# Patient Record
Sex: Male | Born: 1985 | Hispanic: No | Marital: Married | Smoking: Current some day smoker
Health system: Southern US, Community
[De-identification: ages and names within clinical notes are randomized; demographics above are authoritative.]

## PROBLEM LIST (undated history)

## (undated) DIAGNOSIS — E559 Vitamin D deficiency, unspecified: Secondary | ICD-10-CM

## (undated) DIAGNOSIS — M549 Dorsalgia, unspecified: Secondary | ICD-10-CM

## (undated) DIAGNOSIS — D569 Thalassemia, unspecified: Secondary | ICD-10-CM

## (undated) HISTORY — PX: HAND SURGERY: SHX662

---

## 2015-10-28 ENCOUNTER — Emergency Department (HOSPITAL_COMMUNITY)
Admission: EM | Admit: 2015-10-28 | Discharge: 2015-10-28 | Disposition: A | Discharge status: Home / Self Care | Payer: Medicaid Other | Attending: Emergency Medicine | Admitting: Emergency Medicine

## 2015-10-28 ENCOUNTER — Emergency Department (HOSPITAL_COMMUNITY): Payer: Medicaid Other

## 2015-10-28 ENCOUNTER — Encounter (HOSPITAL_COMMUNITY): Payer: Self-pay | Admitting: *Deleted

## 2015-10-28 DIAGNOSIS — J069 Acute upper respiratory infection, unspecified: Secondary | ICD-10-CM | POA: Diagnosis not present

## 2015-10-28 DIAGNOSIS — Z720 Tobacco use: Secondary | ICD-10-CM | POA: Insufficient documentation

## 2015-10-28 DIAGNOSIS — H9203 Otalgia, bilateral: Secondary | ICD-10-CM | POA: Insufficient documentation

## 2015-10-28 DIAGNOSIS — R51 Headache: Secondary | ICD-10-CM | POA: Insufficient documentation

## 2015-10-28 DIAGNOSIS — J029 Acute pharyngitis, unspecified: Secondary | ICD-10-CM | POA: Diagnosis present

## 2015-10-28 DIAGNOSIS — R079 Chest pain, unspecified: Secondary | ICD-10-CM | POA: Diagnosis not present

## 2015-10-28 LAB — RAPID STREP SCREEN (MED CTR MEBANE ONLY): Streptococcus, Group A Screen (Direct): NEGATIVE

## 2015-10-28 MED ORDER — PSEUDOEPHEDRINE HCL 30 MG PO TABS: 30.0000 mg | ORAL_TABLET | ORAL | Status: DC | PRN

## 2015-10-28 MED ORDER — GUAIFENESIN-DM 100-10 MG/5ML PO SYRP: 5.0000 mL | ORAL_SOLUTION | ORAL | Status: DC | PRN

## 2015-10-28 NOTE — ED Notes (Signed)
Pt c/o sore throat & bil ear pain, & non productive cough onset x 1 wk with no relief with OTC meds, A&O x4, pt denies n/v/d

## 2015-10-28 NOTE — ED Notes (Signed)
Declined W/C at D/C and was escorted to lobby by RN. 

## 2015-10-28 NOTE — ED Provider Notes (Signed)
CSN: [id number]     Arrival date & time 10/28/15  1223 History   First MD Initiated Contact with Patient 10/28/15 1310     Chief Complaint  Patient presents with  . Sore Throat     (Consider location/radiation/quality/duration/timing/severity/associated sxs/prior Treatment) HPI Todd Miller is a 29 y.o. male  with no medical problems, presents to emergency department complaining of sore throat, nasal congestion, cough, chest pain. Patient states symptoms started 1 week ago. Patient is from [place] but just came to [place] one [place] ago from [place]. States her vaccinations are up-to-date. Denies any fever or chills. States took some over-the-counter cold medication which did not help. States her significant other is sick with the same exact symptoms. States main concern is pain in the chest especially with coughing.  History reviewed. No pertinent past medical history. History reviewed. No pertinent past surgical history. No family history on file. Social History  Substance Use Topics  . Smoking status: Current Every Day Smoker    Types: Pipe  . Smokeless tobacco: None  . Alcohol Use: No    Review of Systems  Constitutional: Negative for fever and chills.  HENT: Positive for congestion, ear pain, postnasal drip, rhinorrhea, sinus pressure and sore throat.   Respiratory: Positive for cough and chest tightness. Negative for shortness of breath.   Cardiovascular: Positive for chest pain. Negative for palpitations and leg swelling.  Gastrointestinal: Negative for nausea, vomiting, abdominal pain, diarrhea and abdominal distention.  Musculoskeletal: Negative for myalgias, arthralgias, neck pain and neck stiffness.  Skin: Negative for rash.  Allergic/Immunologic: Negative for immunocompromised state.  Neurological: Positive for headaches. Negative for dizziness, weakness, light-headedness and numbness.      Allergies  Review of patient's allergies indicates no known  allergies.  Home Medications   Prior to Admission medications   Not on File   BP 122/79 mmHg  Pulse 57  Temp(Src) 97.7 F (36.5 C) (Oral)  Resp 16  Ht 5\' 5"  (1.651 m)  Wt 129 lb 5 oz (58.656 kg)  BMI 21.52 kg/m2  SpO2 98% Physical Exam  Constitutional: He is oriented to person, place, and time. He appears well-developed and well-nourished. No distress.  HENT:  Head: Normocephalic and atraumatic.  Right Ear: External ear normal.  Left Ear: External ear normal.  Mouth/Throat: Oropharynx is clear and moist.  Clear rhinorrhea, pharynx erythemous, uvula midline  Eyes: Conjunctivae are normal.  Neck: Normal range of motion. Neck supple.  No meningeal signs  Cardiovascular: Normal rate, regular rhythm and normal heart sounds.   Pulmonary/Chest: Effort normal and breath sounds normal. No respiratory distress. He has no wheezes. He has no rales.  Abdominal: Soft. Bowel sounds are normal. There is no tenderness.  Musculoskeletal: He exhibits no edema or tenderness.  Lymphadenopathy:    He has no cervical adenopathy.  Neurological: He is alert and oriented to person, place, and time.  Skin: Skin is warm and dry. No erythema.  Psychiatric: He has a normal mood and affect.  Nursing note and vitals reviewed.   ED Course  Procedures (including critical care time) Labs Review Labs Reviewed  RAPID STREP SCREEN (NOT AT [facility])  CULTURE, GROUP A STREP    Imaging Review Dg Chest 2 View  10/28/2015  CLINICAL DATA:  Sore throat and bilateral ear pain. Nonproductive cough EXAM: CHEST  2 VIEW COMPARISON:  None FINDINGS: The heart size and mediastinal contours are within normal limits. Both lungs are clear. The visualized skeletal structures are unremarkable. IMPRESSION: No active  cardiopulmonary disease. Electronically Signed   By: Signa Kell M.D.   On: 10/28/2015 14:26   I have personally reviewed and evaluated these images and lab results as part of my medical decision-making.   EKG  Interpretation None      MDM   Final diagnoses:  URI (upper respiratory infection)      Pt with URI symptoms. Afebrile. Non toxic appearing. Vs normal. Most likely viral infection. Given recent immigration from [place] countries, x-ray of the chest and strep screen obtained and are both negative. Home with symptomatic treatment. Patient's family member in emergency department with the same symptoms. Return precautions discussed.  Filed Vitals:   10/28/15 1241  BP: 122/79  Pulse: 57  Temp: 97.7 F (36.5 C)  Resp: [address], PA-C 10/28/15 1636  Gwyneth Sprout, MD 10/28/15 1640

## 2015-10-28 NOTE — Discharge Instructions (Signed)
Sudafed as prescribed for congestion. Robitussin as prescribed for cough. Follow up with urgent care or family doctor if not improving.    Upper Respiratory Infection, Adult Most upper respiratory infections (URIs) are caused by a virus. A URI affects the nose, throat, and upper air passages. The most common type of URI is often called "the common cold." HOME CARE   Take medicines only as told by your doctor.  Gargle warm saltwater or take cough drops to comfort your throat as told by your doctor.  Use a warm mist humidifier or inhale steam from a shower to increase air moisture. This may make it easier to breathe.  Drink enough fluid to keep your pee (urine) clear or pale yellow.  Eat soups and other clear broths.  Have a healthy diet.  Rest as needed.  Go back to work when your fever is gone or your doctor says it is okay.  You may need to stay home longer to avoid giving your URI to others.  You can also wear a face mask and wash your hands often to prevent spread of the virus.  Use your inhaler more if you have asthma.  Do not use any tobacco products, including cigarettes, chewing tobacco, or electronic cigarettes. If you need help quitting, ask your doctor. GET HELP IF:  You are getting worse, not better.  Your symptoms are not helped by medicine.  You have chills.  You are getting more short of breath.  You have brown or red mucus.  You have yellow or brown discharge from your nose.  You have pain in your face, especially when you bend forward.  You have a fever.  You have puffy (swollen) neck glands.  You have pain while swallowing.  You have white areas in the back of your throat. GET HELP RIGHT AWAY IF:   You have very bad or constant:  Headache.  Ear pain.  Pain in your forehead, behind your eyes, and over your cheekbones (sinus pain).  Chest pain.  You have long-lasting (chronic) lung disease and any of the  following:  Wheezing.  Long-lasting cough.  Coughing up blood.  A change in your usual mucus.  You have a stiff neck.  You have changes in your:  Vision.  Hearing.  Thinking.  Mood. MAKE SURE YOU:   Understand these instructions.  Will watch your condition.  Will get help right away if you are not doing well or get worse.   This information is not intended to replace advice given to you by your health care provider. Make sure you discuss any questions you have with your health care provider.   Document Released: 05/26/2008 Document Revised: 04/24/2015 Document Reviewed: 03/15/2014 Elsevier Interactive Patient Education [employer]2016 Elsevier Inc.

## 2015-10-31 LAB — CULTURE, GROUP A STREP

## 2016-01-10 LAB — CBC AND DIFFERENTIAL
HEMATOCRIT: 40 % — AB (ref 41–53)
HEMOGLOBIN: 13.2 g/dL — AB (ref 13.5–17.5)
NEUTROS ABS: 4 /uL
Platelets: 175 10*3/uL (ref 150–399)
WBC: 7.1 10^3/mL

## 2016-01-10 LAB — BASIC METABOLIC PANEL
BUN: 12 mg/dL (ref 4–21)
Creatinine: 0.7 mg/dL (ref 0.6–1.3)
GLUCOSE: 110 mg/dL
POTASSIUM: 4 mmol/L (ref 3.4–5.3)
SODIUM: 138 mmol/L (ref 137–147)

## 2016-01-10 LAB — HEPATIC FUNCTION PANEL
ALK PHOS: 56 U/L (ref 25–125)
ALT: 14 U/L (ref 10–40)
AST: 15 U/L (ref 14–40)

## 2016-01-10 LAB — TSH: TSH: 1.22 u[IU]/mL (ref 0.41–5.90)

## 2016-01-23 ENCOUNTER — Other Ambulatory Visit (HOSPITAL_COMMUNITY)
Admission: RE | Admit: 2016-01-23 | Discharge: 2016-01-23 | Disposition: A | Discharge status: Home / Self Care | Payer: Medicaid Other | Source: Ambulatory Visit | Attending: Family Medicine | Admitting: Family Medicine

## 2016-01-23 ENCOUNTER — Ambulatory Visit (INDEPENDENT_AMBULATORY_CARE_PROVIDER_SITE_OTHER): Payer: Medicaid Other | Admitting: Family Medicine

## 2016-01-23 VITALS — BP 119/70 | HR 57 | Temp 98.0°F | Ht 67.5 in | Wt 133.4 lb

## 2016-01-23 DIAGNOSIS — R350 Frequency of micturition: Secondary | ICD-10-CM | POA: Diagnosis not present

## 2016-01-23 DIAGNOSIS — H9391 Unspecified disorder of right ear: Secondary | ICD-10-CM | POA: Diagnosis not present

## 2016-01-23 DIAGNOSIS — Z113 Encounter for screening for infections with a predominantly sexual mode of transmission: Secondary | ICD-10-CM | POA: Insufficient documentation

## 2016-01-23 DIAGNOSIS — R300 Dysuria: Secondary | ICD-10-CM

## 2016-01-23 DIAGNOSIS — Z008 Encounter for other general examination: Secondary | ICD-10-CM

## 2016-01-23 DIAGNOSIS — Z0289 Encounter for other administrative examinations: Secondary | ICD-10-CM | POA: Insufficient documentation

## 2016-01-23 DIAGNOSIS — F411 Generalized anxiety disorder: Secondary | ICD-10-CM | POA: Diagnosis not present

## 2016-01-23 DIAGNOSIS — F17200 Nicotine dependence, unspecified, uncomplicated: Secondary | ICD-10-CM | POA: Diagnosis not present

## 2016-01-23 LAB — POCT URINALYSIS DIPSTICK
BILIRUBIN UA: NEGATIVE
Glucose, UA: NEGATIVE
Ketones, UA: NEGATIVE
LEUKOCYTES UA: NEGATIVE
NITRITE UA: NEGATIVE
PH UA: 6
Protein, UA: NEGATIVE
Spec Grav, UA: 1.025
Urobilinogen, UA: 0.2

## 2016-01-23 LAB — POCT UA - MICROSCOPIC ONLY

## 2016-01-23 NOTE — Assessment & Plan Note (Addendum)
Several year history of this is somewhat confusing. He had a UA collected at last visit with outside office but we don't have this information. We will recollect this, send for culture, collect GC/C as well. Initial UA today had some hemoglobin so will need to re-test at next visit in 1 month.  

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
## 2016-01-23 NOTE — Assessment & Plan Note (Signed)
Patient did not complain of this but noted on exam what appears to be localized hemotympanum of right TM. There is grossly decreased hearing on the right side. Does not complain of any other hearing symptoms. Given return precautions for pain, drainage, bleeding, otherwise will re-evaluate this in 1 month and if not improving refer to ENT.

## 2016-01-23 NOTE — Patient Instructions (Addendum)
Anxiety: Continue the sertraline  once a day. I would like to see you back in about 4 weeks to see how this medicine is doing. We may make a change in the dosing at that time.  Right ear:  We will look at this again at your follow up visit in 3-4 weeks. If you develop any pain, drainage, please call the clinic before this visit

## 2016-01-23 NOTE — Assessment & Plan Note (Signed)
Recently started on sertraline . Will continue this. He has obvious reason for this as his family is still in [place] and [place] with no known timeframe for getting to the [place]. He does identify this as a huge stressor. Will follow him for this in 3 weeks, may titrate sertraline up at that visit.

## 2016-01-25 LAB — URINE CULTURE
COLONY COUNT: NO GROWTH
ORGANISM ID, BACTERIA: NO GROWTH

## 2016-01-25 LAB — URINE CYTOLOGY ANCILLARY ONLY
Chlamydia: NEGATIVE
Neisseria Gonorrhea: NEGATIVE

## 2016-02-13 ENCOUNTER — Encounter: Payer: Self-pay | Admitting: Family Medicine

## 2016-02-13 LAB — VITAMIN D 25-HYDROXY, D2 + D3: Vit D, 25-Hydroxy: 12 ng/ml — ABNORMAL LOW

## 2016-02-13 LAB — CBC AND DIFFERENTIAL
MCV: 60.1 fL — ABNORMAL LOW
RDW: 16.1 — ABNORMAL HIGH

## 2016-02-15 ENCOUNTER — Ambulatory Visit (INDEPENDENT_AMBULATORY_CARE_PROVIDER_SITE_OTHER): Payer: Medicaid Other | Admitting: Family Medicine

## 2016-02-15 VITALS — BP 121/74 | HR 70 | Temp 98.4°F | Ht 68.0 in | Wt 133.0 lb

## 2016-02-15 DIAGNOSIS — E559 Vitamin D deficiency, unspecified: Secondary | ICD-10-CM | POA: Diagnosis not present

## 2016-02-15 DIAGNOSIS — R350 Frequency of micturition: Secondary | ICD-10-CM | POA: Diagnosis not present

## 2016-02-15 DIAGNOSIS — H9391 Unspecified disorder of right ear: Secondary | ICD-10-CM

## 2016-02-15 DIAGNOSIS — D649 Anemia, unspecified: Secondary | ICD-10-CM | POA: Diagnosis not present

## 2016-02-15 LAB — POCT URINALYSIS DIPSTICK
Bilirubin, UA: NEGATIVE
GLUCOSE UA: NEGATIVE
Leukocytes, UA: NEGATIVE
Nitrite, UA: NEGATIVE
Protein, UA: NEGATIVE
Urobilinogen, UA: 1
pH, UA: 6

## 2016-02-15 MED ORDER — FERROUS SULFATE 325 (65 FE) MG PO TABS: 325.0000 mg | ORAL_TABLET | Freq: Every day | ORAL | Status: DC

## 2016-02-15 MED ORDER — VITAMIN D3 1.25 MG (50000 UT) PO TABS: 1.0000 | ORAL_TABLET | ORAL | Status: DC

## 2016-02-15 NOTE — Progress Notes (Signed)
   Subjective:    Patient ID: Todd Miller, male    DOB: July 23, 1986, 30 y.o.   MRN: [id number]  HPI  [employer] video interpreter Todd Miller [phone] used   CC: follow up ear  # Right ear:  At initial visit on ear exam it was noted what appeared to be intratympanic blood on the right TM  Has not had any issues with right ear, no drainage, no pain, no change in hearing  # Nausea/lack of appetite  Present for "years" -- at least 2  He will force himself to eat but doesn't like to do this  Nausea but no vomiting  No diarrhea, constipation, abdominal pain  Reviewed labs from previous PCP, he is iron deficient (likely with Hgb 13, MCV 60), and vitamin D deficient (level was 12)  Social Hx: current smoker  Review of Systems   See HPI for ROS.   Past medical history, surgical, family, and social history reviewed and updated in the EMR as appropriate. Objective:  BP 121/74 mmHg  Pulse 70  Temp(Src) 98.4 F (36.9 C) (Oral)  Ht  (1.727 m)  Wt 133 lb (60.328 kg)  BMI 20.23 kg/m2 Vitals and nursing note reviewed  General: no apparent distress  Ears: both TMs are pearly gray bilaterally without effusion. There is no blood noted on the right TM CV: normal rate, regular rhythm, no murmurs, rubs or gallop  Resp: clear to auscultation bilaterally, normal effort Abdomen: thin, soft, nontender, nondistended, normal bowel sounds   Assessment & Plan:  Problem of right ear No blood present on right TM today, so likely transient and has self resolved. No additional follow up needed unless develops issues.  Anemia Likely iron deficiency (MCV 60, increased RDW). In setting of poor appetite this may be the cause, though would not rule out all other causes especially in setting of low vitamin D level as well. Will replete with iron supplement, check CBC and likely iron study in about 6-8 weeks.  Vitamin D deficiency Level at 12 at prior PCP. Replete 50k units x 8 weeks then recheck  level.  Urinary frequency Repeat UA was still positive for some hemoglobin. Urine culture was negative at last visit, and negative GC/C.   Per Todd Miller last note, will need treatment for schisto -- will discuss with Todd Miller and contact patient.

## 2016-02-15 NOTE — Patient Instructions (Addendum)
Right ear looks normal. We were correct that it was blood from last visit and this has gone away now. We do not need to have you see the ENT (Ear doctors)  I received the lab work from your other doctor's office. Your hemoglobin and vitamin D were both very low, which means you have Anemia and Vitamin D deficiency.  Anemia: Start by taking the iron tablet once a day. This can make you constipated and sick to your stomach, but continue using it as it should hopefully make you feel better  Do not take the iron pill with millk, tums, raw vegetables, high fiber foods   Vitamin D deficiency: Take 50,000 units of Vitamin D3 once a week for 8 weeks. After this you should take 5,000 units daily, and return to the clinic to get your blood checked.  Vitamin D is best absorbed when eaten with foods or drinks high in fat.

## 2016-02-19 DIAGNOSIS — E559 Vitamin D deficiency, unspecified: Secondary | ICD-10-CM | POA: Insufficient documentation

## 2016-02-19 DIAGNOSIS — D649 Anemia, unspecified: Secondary | ICD-10-CM | POA: Insufficient documentation

## 2016-02-19 NOTE — Assessment & Plan Note (Signed)
Level at 12 at prior PCP. Replete 50k units x 8 weeks then recheck level.

## 2016-02-19 NOTE — Assessment & Plan Note (Signed)
No blood present on right TM today, so likely transient and has self resolved. No additional follow up needed unless develops issues.

## 2016-02-19 NOTE — Assessment & Plan Note (Signed)
Likely iron deficiency (MCV 60, increased RDW). In setting of poor appetite this may be the cause, though would not rule out all other causes especially in setting of low vitamin D level as well. Will replete with iron supplement, check CBC and likely iron study in about 6-8 weeks.

## 2016-02-19 NOTE — Assessment & Plan Note (Signed)
Repeat UA was still positive for some hemoglobin. Urine culture was negative at last visit, and negative GC/C.   Per Dr. Tyson Alias last note, will need treatment for schisto -- will discuss with Dr. Gwendolyn Grant and contact patient.

## 2016-02-25 ENCOUNTER — Telehealth: Payer: Self-pay | Admitting: Family Medicine

## 2016-02-25 DIAGNOSIS — B659 Schistosomiasis, unspecified: Secondary | ICD-10-CM

## 2016-02-25 MED ORDER — PRAZIQUANTEL POWD: 1200.0000 mg | Freq: Four times a day (QID) | Status: DC

## 2016-02-25 NOTE — Telephone Encounter (Signed)
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 I called the local pharmacies and the only one in stock is [facility], informed her of this and that the prescription would be sent there. No further questions. -Dr. Waynetta SandyWight

## 2016-02-26 ENCOUNTER — Telehealth: Payer: Self-pay | Admitting: Family Medicine

## 2016-02-26 NOTE — Telephone Encounter (Signed)
Cary called because the medication that the doctor is trying to get the patient at [facility] is 600.00 they do not file insurance and they would have to special order this. They suggested that we try [facility] since they take medicaid. The other issues is that Medicaid might not cover this and we could try a prior authorization to see. If Medicaid will not cover this a different medication will need to be prescribe. jw

## 2016-02-28 MED ORDER — PRAZIQUANTEL 600 MG PO TABS: 1200.0000 mg | ORAL_TABLET | Freq: Three times a day (TID) | ORAL | Status: DC

## 2016-02-28 NOTE — Telephone Encounter (Signed)
Called and spoke with [facility] on [address], they actually have the praziquantel tablets in stock, rx sent there. Spoke with Revereary on the phone and told her that they should try and get this filled, but we may need to do some paperwork before Medicaid will cover; first step is providing [facility] with their medicaid cards. She had no further questions. -Dr. Waynetta SandyWight

## 2016-04-17 ENCOUNTER — Ambulatory Visit: Payer: Medicaid Other | Admitting: Family Medicine

## 2016-04-18 ENCOUNTER — Ambulatory Visit: Payer: Medicaid Other | Admitting: Family Medicine

## 2016-04-18 ENCOUNTER — Ambulatory Visit (INDEPENDENT_AMBULATORY_CARE_PROVIDER_SITE_OTHER): Payer: Medicaid Other | Admitting: Family Medicine

## 2016-04-18 ENCOUNTER — Encounter: Payer: Self-pay | Admitting: Family Medicine

## 2016-04-18 VITALS — BP 119/75 | HR 67 | Temp 98.6°F | Ht 68.0 in | Wt 133.3 lb

## 2016-04-18 DIAGNOSIS — R0989 Other specified symptoms and signs involving the circulatory and respiratory systems: Secondary | ICD-10-CM | POA: Insufficient documentation

## 2016-04-18 DIAGNOSIS — E559 Vitamin D deficiency, unspecified: Secondary | ICD-10-CM | POA: Diagnosis not present

## 2016-04-18 DIAGNOSIS — R1310 Dysphagia, unspecified: Secondary | ICD-10-CM

## 2016-04-18 DIAGNOSIS — D509 Iron deficiency anemia, unspecified: Secondary | ICD-10-CM | POA: Diagnosis present

## 2016-04-18 LAB — CBC WITH DIFFERENTIAL/PLATELET
Basophils Absolute: 0 cells/uL (ref 0–200)
Basophils Relative: 0 %
EOS PCT: 1 %
Eosinophils Absolute: 70 cells/uL (ref 15–500)
HCT: 40.5 % (ref 38.5–50.0)
HEMOGLOBIN: 13.2 g/dL (ref 13.2–17.1)
LYMPHS ABS: 2170 {cells}/uL (ref 850–3900)
Lymphocytes Relative: 31 %
MCH: 19.4 pg — ABNORMAL LOW (ref 27.0–33.0)
MCHC: 32.6 g/dL (ref 32.0–36.0)
MCV: 59.6 fL — ABNORMAL LOW (ref 80.0–100.0)
MONOS PCT: 6 %
Monocytes Absolute: 420 cells/uL (ref 200–950)
NEUTROS ABS: 4340 {cells}/uL (ref 1500–7800)
NEUTROS PCT: 62 %
PLATELETS: 134 10*3/uL — AB (ref 140–400)
RBC: 6.79 MIL/uL — AB (ref 4.20–5.80)
RDW: 16 % — ABNORMAL HIGH (ref 11.0–15.0)
WBC: 7 10*3/uL (ref 3.8–10.8)

## 2016-04-18 LAB — ANEMIA PANEL
%SAT: 61 % — AB (ref 15–60)
ABS RETIC: 88270 {cells}/uL (ref 25000–90000)
FOLATE: 14.6 ng/mL (ref >5.4)
Ferritin: 53 ng/mL (ref 20–345)
Iron: 159 ug/dL (ref 50–180)
RBC.: 6.79 MIL/uL — ABNORMAL HIGH (ref 4.20–5.80)
RETIC CT PCT: 1.3 %
TIBC: 260 ug/dL (ref 250–425)
UIBC: 101 ug/dL — ABNORMAL LOW (ref 125–400)
Vitamin B-12: 170 pg/mL — ABNORMAL LOW (ref 200–1100)

## 2016-04-18 NOTE — Progress Notes (Signed)
Subjective:     Patient ID: Todd Miller, male   DOB: May 23, 1986, 30 y.o.   MRN: [id number]  HPI  Anemia: Here for follow up. He is compliant with his iron pills. Vitamin D: Here for follow up. He is compliant with his Vit D supplement. Throat pain: Feels like there is something stuck in his throat. This has been ongoing for two weeks. He had this in the past before coming to the state which resolved on its own till lately. It occurs whenever he is eating solid. No difficulty swallowing. No nausea or vomiting. No change in bowel habit. No stomach pain. No weight loss.    Current Outpatient Prescriptions on File Prior to Visit  Medication Sig Dispense Refill  . Cholecalciferol (VITAMIN D3) 50000 units TABS Take 1 tablet by mouth once a week. For 8 weeks 8 tablet 0  . ferrous sulfate (FERROUSUL) 325 (65 FE) MG tablet Take 1 tablet (325 mg total) by mouth daily with breakfast. 90 tablet 1  . praziquantel (BILTRICIDE) 600 MG tablet Take 2 tablets (1,200 mg total) by mouth 3 (three) times daily. For 3 doses total, take with food (Patient not taking: Reported on 04/18/2016) 6 tablet 0   No current facility-administered medications on file prior to visit.   History reviewed. No pertinent past medical history. Filed Vitals:   04/18/16 1033  BP: 119/75  Pulse: 67  Temp: 98.6 F (37 C)  TempSrc: Oral  Height: 5\' 8"  (1.727 m)  Weight: 133 lb 4.8 oz (60.464 kg)  SpO2: 98%      Review of Systems  HENT:       Feeling of food stuck in his throat.  Respiratory: Negative.   Cardiovascular: Negative.   Gastrointestinal: Negative.   All other systems reviewed and are negative.      Objective:   Physical Exam  Constitutional: He is oriented to person, place, and time. He appears well-developed. No distress.  HENT:  Head: Normocephalic.  Mouth/Throat: Uvula is midline, oropharynx is clear and moist and mucous membranes are normal.  Cardiovascular: Normal rate, regular rhythm and normal  heart sounds.   No murmur heard. Pulmonary/Chest: Effort normal and breath sounds normal. No respiratory distress. He has no wheezes. He exhibits no tenderness.  Abdominal: Soft. Bowel sounds are normal. He exhibits no distension and no mass. There is no tenderness.  Neurological: He is alert and oriented to person, place, and time.  Nursing note and vitals reviewed.      Assessment:     Anemia: Vitamin D: Throat problem    Plan:     Check problem list.

## 2016-04-18 NOTE — Assessment & Plan Note (Signed)
Last CBC barely low. Concern for low MCV and elevated RDW which could suggest iron deficiency anemia,but could be due to other hematologic causes ( Thalassemia, Sickle Cell Anemia). CBC and anemia panel checked today. I will contact him with result. Continue iron supplement. F/U with PCP for further evaluation and management.

## 2016-04-18 NOTE — Assessment & Plan Note (Signed)
Etiology unclear. Differential include tumor ( too young), GERD,anxiety, abnormal esophageal structure. Patient advised to chew food adequately before swallowing. If no improvement in 1-2 wks he is advised to contact his PCP for swallow eval/study vs GI referral. If worsening to go to the ED or contact our office. She verbalized understanding. (Interpreter used during visit)

## 2016-04-18 NOTE — Assessment & Plan Note (Signed)
On Vit D for 8 weeks. He has the last dose of his Vit D 50,000 units. Vit D level rechecked today. I will contact him with result.

## 2016-04-18 NOTE — Patient Instructions (Signed)
It was nice seeing you today. We will check your labs today and contact you with result. For your throat, your exam was normal. You can use tylenol as needed for pain. Ensure proper chewing of your food before swallowing. If no improvement in 1-2 weeks please schedule appointment with your PCP for specialist referral.

## 2016-04-19 LAB — VITAMIN D 25 HYDROXY (VIT D DEFICIENCY, FRACTURES): VIT D 25 HYDROXY: 53 ng/mL (ref 30–100)

## 2016-04-21 ENCOUNTER — Telehealth: Payer: Self-pay | Admitting: Family Medicine

## 2016-04-21 NOTE — Telephone Encounter (Signed)
I called and discussed his results with him using an interpreter.  Vit D normalized. I recommended OTC vitamin D supplement. He already completed Vit D 50,000 units.  CBC still normal, ferritin is normal, TIBC is normal. Less likely iron deficiency but a possibility.  Concerns.  1. MCV still low, differentials: Thalassemia, Sickle Cell anemia,lead poisoning, anemia of chronic disease.  2. Vitamin B12 is low. Unusual with low MCV. He will need supplement ( Injectable). 3. Platelet is low. Etiology unclear. R/O liver disease as a potential cause.  As discussed with him, he is to schedule follow up with PCP as soon as possible to discuss further evaluation and management.  I called back to discuss vitamin B12 treatment option with him. He can do either OTC Vitamin B12 1000 mcg qd vs IM Vitamin B12 Q monthly. He is able to come in only on Fridays due to work. Front office will schedule appointment with his PCP to make treatment arrangement with nursing. In the mean time, I recommended OTC MVI.  I gave patient's information to Abundio MiuBarbara McGregor to help with appointment scheduling. Patient advise to contact office if he did not receive scheduling call in few days. He verbalized understanding and agreed with plan.   I forwarded this note to PCP so he can follow up with result and manage appropriately from now.  NB: Although he did not mention this today. During last visit he mentioned throat pain and feeling of food stuck in his throat. PCP to please follow up with him on this. If persistent he will need swallow study and/or referral to GI.

## 2016-04-28 ENCOUNTER — Encounter: Payer: Self-pay | Admitting: Family Medicine

## 2016-04-28 ENCOUNTER — Ambulatory Visit (INDEPENDENT_AMBULATORY_CARE_PROVIDER_SITE_OTHER): Payer: Medicaid Other | Admitting: Family Medicine

## 2016-04-28 VITALS — BP 119/71 | HR 61 | Temp 98.7°F | Ht 68.0 in | Wt 136.0 lb

## 2016-04-28 DIAGNOSIS — D509 Iron deficiency anemia, unspecified: Secondary | ICD-10-CM

## 2016-04-28 DIAGNOSIS — F458 Other somatoform disorders: Secondary | ICD-10-CM | POA: Diagnosis not present

## 2016-04-28 DIAGNOSIS — D649 Anemia, unspecified: Secondary | ICD-10-CM

## 2016-04-28 DIAGNOSIS — R630 Anorexia: Secondary | ICD-10-CM | POA: Diagnosis not present

## 2016-04-28 DIAGNOSIS — E559 Vitamin D deficiency, unspecified: Secondary | ICD-10-CM | POA: Diagnosis not present

## 2016-04-28 DIAGNOSIS — R0989 Other specified symptoms and signs involving the circulatory and respiratory systems: Secondary | ICD-10-CM

## 2016-04-28 MED ORDER — SERTRALINE HCL 25 MG PO TABS: 25.0000 mg | ORAL_TABLET | Freq: Every day | ORAL | Status: DC

## 2016-04-28 NOTE — Patient Instructions (Addendum)
Your anemia is most likely from a thalassemia. We will do a blood test today to look at the hemoglobin to help identify which type you have.  Foods high in iron are meats, green leafy vegetables like spinach.  You can continue the iron tablet 1-3 times a week if you would like to do so.   We made a referral to the GI doctors for your throat. You can follow up with our clinic after this appointment or sooner if you have any other concerns.

## 2016-04-28 NOTE — Progress Notes (Signed)
   Subjective:    Patient ID: Todd Miller, male    DOB: 1986-05-12, 30 y.o.   MRN: [id number]  HPI  Stratus Isabelle CourseLydia [phone]  CC: lab results  # Anemia:  Taking iron, no issues  Denies any fatigue  Says today that his brother and sister have been diagnosed with thalassemia.  ROS: no bleeding, no dark stools  # Globus sensation  Present for about "1 month" -- though difficult to say if present before as interpreter had to clarify multiple times  Feeling of food getting stuck in throat after eating; but also says feels this all the time  Gets the feeling with solids and liquids  Describes feelings of heartburn in the chest, this is not made worse with eating  Has been taking an OTC antacid (not sure the name but was told to start it) without any improvement. Has taken for 2 weeks and sees no difference.  Some vague description of chest discomfort but no improvement with antacid  Has a poor appetite, this seems to be long term ROS: no vomiting, no diarrhea, no cough  # Vitamin D deficiency  Finished 50k units x 8 weeks  Denies fatigue, sleepiness  Social Hx: smokes some tobacco products/hookah  Review of Systems   See HPI for ROS.   Past medical history, surgical, family, and social history reviewed and updated in the EMR as appropriate. Objective:  BP 119/71 mmHg  Pulse 61  Temp(Src) 98.7 F (37.1 C) (Oral)  Ht 5\' 8"  (1.727 m)  Wt 136 lb (61.689 kg)  BMI 20.68 kg/m2  SpO2 99% Vitals and nursing note reviewed  General: no apparent distress  ENTM: there is no posterior pharyngeal erythema or lesions. Moist mucous membranes.  Neck: normal thyroid, no nodules, normal and symmetric elevation CV: normal rate, regular rhythm, no murmurs, rubs or gallop. Resp: clear to auscultation bilaterally, normal effort  Assessment & Plan:  Globus sensation Differential includes  Globus sensation, GERD, esophageal motility disorder. No apparent relief with antacid  (though not exactly clear what he was taking). After discussion with patient they would prefer to go see GI, so referral was made. Follow up after this appointment.   Poor appetite Patient reported improvement with sertraline, which we will restart (on low dose 25mg ). Follow up as above.  Vitamin D deficiency D3 level was normal. Recommended continuing 1000-2000 units D3 a few times a week.   Anemia On history today it was discovered that patient's brother and sister both have thalassemia. This is almost certainly going to be the cause of his anemia. Discussed that he can stop taking the daily iron and just ensure getting adequate iron from diet. For confirmation ordered hgb electrophoresis, will follow up with results.

## 2016-04-30 DIAGNOSIS — R630 Anorexia: Secondary | ICD-10-CM | POA: Insufficient documentation

## 2016-04-30 NOTE — Assessment & Plan Note (Signed)
On history today it was discovered that patient's brother and sister both have thalassemia. This is almost certainly going to be the cause of his anemia. Discussed that he can stop taking the daily iron and just ensure getting adequate iron from diet. For confirmation ordered hgb electrophoresis, will follow up with results.

## 2016-04-30 NOTE — Assessment & Plan Note (Signed)
Differential includes  Globus sensation, GERD, esophageal motility disorder. No apparent relief with antacid (though not exactly clear what he was taking). After discussion with patient they would prefer to go see GI, so referral was made. Follow up after this appointment.

## 2016-04-30 NOTE — Assessment & Plan Note (Signed)
Patient reported improvement with sertraline, which we will restart (on low dose 25mg ). Follow up as above.

## 2016-04-30 NOTE — Assessment & Plan Note (Signed)
D3 level was normal. Recommended continuing 1000-2000 units D3 a few times a week.

## 2016-05-01 ENCOUNTER — Telehealth: Payer: Self-pay | Admitting: Family Medicine

## 2016-05-01 LAB — HEMOGLOBINOPATHY EVALUATION
HGB F QUANT: 0.5 % (ref 0.0–2.0)
Hemoglobin Other: 0 %
Hgb A2 Quant: 5.4 % — ABNORMAL HIGH (ref 2.2–3.2)
Hgb A: 94.1 % — ABNORMAL LOW (ref 96.8–97.8)
Hgb S Quant: 0 %

## 2016-05-01 NOTE — Telephone Encounter (Signed)
I called to follow up with patient for his Vitamin B12 deficiency. Per his recent visit with his PCP, no documentation about his Vitamin B12 deficiency and management. Patient informed me with the help of the interpreter that this was discussed and his PCP gave him treatment option with supplement or diet and he chose diet. I was going to [employer] B12 1000mcg qd for him today but he stated per discussion with his PCP he will prefer to improve on his diet and recheck in few week.  He is advised to see his PCP in about 2-4 wks for repeat test. I will forward to PCP to follow up with this as well as his low platelet and sign off on patient.

## 2016-05-02 ENCOUNTER — Ambulatory Visit: Payer: Medicaid Other | Admitting: Family Medicine

## 2016-05-09 ENCOUNTER — Ambulatory Visit: Payer: Medicaid Other | Admitting: Family Medicine

## 2016-05-16 MED ORDER — VITAMIN B-12 1000 MCG PO TABS: 1000.0000 ug | ORAL_TABLET | Freq: Every day | ORAL | Status: DC

## 2016-05-16 NOTE — Telephone Encounter (Signed)
Spoke with patient at his wife's office visit 05/16/2016, we had actually discussed stopping the iron supplement for the time being, Vitamin B12 was not discussed since I did not see the lab result when we were going over this at his office visit. I discussed today that he should take the vitamin B12 supplement, which I sent in today. Plan on repeating B12 level in 1-2 months.

## 2016-05-16 NOTE — Addendum Note (Signed)
Addended by: Nani RavensWIGHT, Keierra Nudo M on: 05/16/2016 05:09 PM   Modules accepted: Orders

## 2016-05-27 ENCOUNTER — Emergency Department (HOSPITAL_COMMUNITY): Payer: Medicaid Other

## 2016-05-27 ENCOUNTER — Emergency Department (HOSPITAL_COMMUNITY)
Admission: EM | Admit: 2016-05-27 | Discharge: 2016-05-27 | Disposition: A | Discharge status: Home / Self Care | Payer: Medicaid Other | Attending: Emergency Medicine | Admitting: Emergency Medicine

## 2016-05-27 ENCOUNTER — Encounter (HOSPITAL_COMMUNITY): Payer: Self-pay

## 2016-05-27 DIAGNOSIS — F17200 Nicotine dependence, unspecified, uncomplicated: Secondary | ICD-10-CM | POA: Insufficient documentation

## 2016-05-27 DIAGNOSIS — Y92410 Unspecified street and highway as the place of occurrence of the external cause: Secondary | ICD-10-CM | POA: Insufficient documentation

## 2016-05-27 DIAGNOSIS — Z79899 Other long term (current) drug therapy: Secondary | ICD-10-CM | POA: Diagnosis not present

## 2016-05-27 DIAGNOSIS — Y9389 Activity, other specified: Secondary | ICD-10-CM | POA: Diagnosis not present

## 2016-05-27 DIAGNOSIS — S22080A Wedge compression fracture of T11-T12 vertebra, initial encounter for closed fracture: Secondary | ICD-10-CM | POA: Insufficient documentation

## 2016-05-27 DIAGNOSIS — T1490XA Injury, unspecified, initial encounter: Secondary | ICD-10-CM

## 2016-05-27 DIAGNOSIS — Y998 Other external cause status: Secondary | ICD-10-CM | POA: Diagnosis not present

## 2016-05-27 DIAGNOSIS — S199XXA Unspecified injury of neck, initial encounter: Secondary | ICD-10-CM | POA: Diagnosis not present

## 2016-05-27 DIAGNOSIS — S29002A Unspecified injury of muscle and tendon of back wall of thorax, initial encounter: Secondary | ICD-10-CM | POA: Diagnosis present

## 2016-05-27 DIAGNOSIS — S3992XA Unspecified injury of lower back, initial encounter: Secondary | ICD-10-CM | POA: Diagnosis not present

## 2016-05-27 MED ORDER — MORPHINE SULFATE (PF) 4 MG/ML IV SOLN
6.0000 mg | Freq: Once | INTRAVENOUS | Status: AC
  Administered 2016-05-27: 6 mg via INTRAVENOUS
  Filled 2016-05-27: qty 2

## 2016-05-27 MED ORDER — METHOCARBAMOL 500 MG PO TABS
500.0000 mg | ORAL_TABLET | Freq: Once | ORAL | Status: AC
  Administered 2016-05-27: 500 mg via ORAL
  Filled 2016-05-27: qty 1

## 2016-05-27 MED ORDER — METHOCARBAMOL 500 MG PO TABS: 500.0000 mg | ORAL_TABLET | Freq: Two times a day (BID) | ORAL | Status: DC

## 2016-05-27 MED ORDER — IBUPROFEN 800 MG PO TABS: 800.0000 mg | ORAL_TABLET | Freq: Three times a day (TID) | ORAL | Status: DC | PRN

## 2016-05-27 MED ORDER — OXYCODONE-ACETAMINOPHEN 5-325 MG PO TABS: 1.0000 | ORAL_TABLET | ORAL | Status: DC | PRN

## 2016-05-27 MED ORDER — IOPAMIDOL (ISOVUE-300) INJECTION 61%
INTRAVENOUS | Status: AC
  Filled 2016-05-27: qty 100

## 2016-05-27 MED ORDER — MORPHINE SULFATE (PF) 2 MG/ML IV SOLN: 2.0000 mg | Freq: Once | INTRAVENOUS | Status: DC

## 2016-05-27 MED ORDER — KETOROLAC TROMETHAMINE 30 MG/ML IJ SOLN
30.0000 mg | Freq: Once | INTRAMUSCULAR | Status: AC
  Administered 2016-05-27: 30 mg via INTRAVENOUS
  Filled 2016-05-27: qty 1

## 2016-05-27 MED ORDER — HYDROCODONE-ACETAMINOPHEN 5-325 MG PO TABS: 1.0000 | ORAL_TABLET | Freq: Four times a day (QID) | ORAL | Status: DC | PRN

## 2016-05-27 MED ORDER — OXYCODONE-ACETAMINOPHEN 5-325 MG PO TABS
1.0000 | ORAL_TABLET | Freq: Once | ORAL | Status: AC
  Administered 2016-05-27: 1 via ORAL
  Filled 2016-05-27: qty 1

## 2016-05-27 MED ORDER — IOPAMIDOL (ISOVUE-300) INJECTION 61%
INTRAVENOUS | Status: AC
Start: 2016-05-27 — End: 2016-05-27
  Administered 2016-05-27: 100 mL
  Filled 2016-05-27: qty 100

## 2016-05-27 MED ORDER — MORPHINE SULFATE (PF) 2 MG/ML IV SOLN
2.0000 mg | Freq: Once | INTRAVENOUS | Status: AC
  Administered 2016-05-27: 2 mg via INTRAMUSCULAR
  Filled 2016-05-27: qty 1

## 2016-05-27 NOTE — ED Notes (Signed)
Spoke to ortho and jen will order brace and it will be delivered and then pt will potentially go home
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
## 2016-05-27 NOTE — ED Notes (Signed)
Pt returned to room from imaging dept. Requesting to use restroom.
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
## 2016-05-27 NOTE — ED Notes (Signed)
Pt stable, ambulatory, states understanding of discharge instructions 

## 2016-05-27 NOTE — ED Notes (Signed)
Family in room pt sleeping
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
## 2016-05-27 NOTE — Progress Notes (Signed)
Orthopedic Tech Progress Note Patient Details:  Aniceto Bossyman Khaled Pham 04-10-1986 [id number]  Patient ID: Annitta JerseyAyman Khaled Wellman, male   DOB: 04-10-1986, 30 y.o.   MRN: [id number]   Saul FordyceJennifer C Kasidy Gianino 05/27/2016, 12:23 PMCalled [employer] for TLSO.

## 2016-05-27 NOTE — Discharge Instructions (Signed)
Follow-up with the neurosurgeon provided.  Return here as needed.  Wear the brace for comfort

## 2016-07-16 ENCOUNTER — Ambulatory Visit: Payer: Medicaid Other | Admitting: Student

## 2016-07-17 ENCOUNTER — Ambulatory Visit (INDEPENDENT_AMBULATORY_CARE_PROVIDER_SITE_OTHER): Payer: Medicaid Other | Admitting: Internal Medicine

## 2016-07-17 VITALS — BP 113/73 | HR 75 | Wt 134.0 lb

## 2016-07-17 DIAGNOSIS — M5442 Lumbago with sciatica, left side: Secondary | ICD-10-CM

## 2016-07-17 DIAGNOSIS — H5710 Ocular pain, unspecified eye: Secondary | ICD-10-CM | POA: Insufficient documentation

## 2016-07-17 DIAGNOSIS — M5441 Lumbago with sciatica, right side: Secondary | ICD-10-CM

## 2016-07-17 DIAGNOSIS — M5489 Other dorsalgia: Secondary | ICD-10-CM | POA: Diagnosis not present

## 2016-07-17 DIAGNOSIS — T148 Other injury of unspecified body region: Secondary | ICD-10-CM

## 2016-07-17 DIAGNOSIS — IMO0002 Reserved for concepts with insufficient information to code with codable children: Secondary | ICD-10-CM

## 2016-07-17 DIAGNOSIS — H5713 Ocular pain, bilateral: Secondary | ICD-10-CM | POA: Diagnosis present

## 2016-07-17 DIAGNOSIS — G8929 Other chronic pain: Secondary | ICD-10-CM | POA: Insufficient documentation

## 2016-07-17 MED ORDER — OXYCODONE-ACETAMINOPHEN 5-325 MG PO TABS: 1.0000 | ORAL_TABLET | ORAL | 0 refills | Status: DC | PRN
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
## 2016-07-17 NOTE — Patient Instructions (Signed)
It was so nice to meet you!  I have sent in referrals to physical therapy and ophthalmology. You should hear from our office to schedule these appointments.  -Dr. Nancy Marus
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
## 2016-07-31 ENCOUNTER — Ambulatory Visit: Payer: Medicaid Other | Admitting: Physical Therapy

## 2016-08-05 ENCOUNTER — Ambulatory Visit: Payer: Medicaid Other | Admitting: Physical Therapy

## 2016-08-07 ENCOUNTER — Ambulatory Visit: Payer: Medicaid Other | Attending: Family Medicine | Admitting: Physical Therapy

## 2016-08-07 DIAGNOSIS — M6281 Muscle weakness (generalized): Secondary | ICD-10-CM | POA: Insufficient documentation

## 2016-08-07 DIAGNOSIS — M545 Low back pain: Secondary | ICD-10-CM | POA: Insufficient documentation

## 2016-08-07 DIAGNOSIS — T148 Other injury of unspecified body region: Secondary | ICD-10-CM | POA: Insufficient documentation

## 2016-08-07 DIAGNOSIS — X58XXXA Exposure to other specified factors, initial encounter: Secondary | ICD-10-CM | POA: Insufficient documentation

## 2016-08-07 DIAGNOSIS — IMO0002 Reserved for concepts with insufficient information to code with codable children: Secondary | ICD-10-CM
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
## 2016-08-07 NOTE — Patient Instructions (Addendum)
  Discussed walking program at home: begin walking 5-10 minutes 2-3 times each day and increasing time as pt can tolerate.

## 2016-08-15 ENCOUNTER — Ambulatory Visit (INDEPENDENT_AMBULATORY_CARE_PROVIDER_SITE_OTHER): Payer: Medicaid Other | Admitting: Internal Medicine

## 2016-08-15 ENCOUNTER — Encounter: Payer: Self-pay | Admitting: Internal Medicine

## 2016-08-15 VITALS — BP 111/80 | HR 66 | Temp 97.5°F | Ht 66.0 in | Wt 134.0 lb

## 2016-08-15 DIAGNOSIS — T148 Other injury of unspecified body region: Secondary | ICD-10-CM | POA: Diagnosis present

## 2016-08-15 DIAGNOSIS — IMO0002 Reserved for concepts with insufficient information to code with codable children: Secondary | ICD-10-CM

## 2016-08-15 MED ORDER — OXYCODONE-ACETAMINOPHEN 5-325 MG PO TABS: 1.0000 | ORAL_TABLET | ORAL | 0 refills | Status: DC | PRN

## 2016-08-15 NOTE — Patient Instructions (Signed)
I will refer you to a specialist (Physical Medicine and Rehab) for your back pain. They will call you with an appointment.

## 2016-08-20 NOTE — Assessment & Plan Note (Signed)
No red flags. Refilled Percocet, but I told patient this is not a chronic medication and will not provide further refills. Referral to PM&R for possible spinal injection to help with pain.
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
## 2016-09-01 ENCOUNTER — Ambulatory Visit (INDEPENDENT_AMBULATORY_CARE_PROVIDER_SITE_OTHER): Payer: Medicaid Other | Admitting: Family Medicine

## 2016-09-01 ENCOUNTER — Encounter: Payer: Self-pay | Admitting: Family Medicine

## 2016-09-01 DIAGNOSIS — T148 Other injury of unspecified body region: Secondary | ICD-10-CM | POA: Diagnosis present

## 2016-09-01 DIAGNOSIS — IMO0002 Reserved for concepts with insufficient information to code with codable children: Secondary | ICD-10-CM

## 2016-09-01 MED ORDER — TRAMADOL HCL 50 MG PO TABS: 50.0000 mg | ORAL_TABLET | Freq: Three times a day (TID) | ORAL | 0 refills | Status: DC | PRN
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
## 2016-09-01 NOTE — Patient Instructions (Signed)
Start taking Ibuprofen 200mg  and Tylenol 500mg  every 6 hours as needed for pain.  You may also take tramadol, it can make you sleepy.  500mg   6    .         .  albad' fi aitikhadh aybubrwfin 200mg  watilinwl 500mg  kl 6 saeat hsb alhajat lil'alm. qad takhudh 'aydaan taramadula, ymkn 'an tajealak naesanin.
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
## 2016-09-11 ENCOUNTER — Ambulatory Visit: Payer: Medicaid Other | Admitting: Internal Medicine

## 2016-09-11 ENCOUNTER — Encounter: Payer: Self-pay | Admitting: Internal Medicine

## 2016-09-11 ENCOUNTER — Ambulatory Visit (INDEPENDENT_AMBULATORY_CARE_PROVIDER_SITE_OTHER): Payer: Medicaid Other | Admitting: Internal Medicine

## 2016-09-11 VITALS — BP 115/74 | HR 65 | Temp 98.2°F | Ht 66.0 in | Wt 130.6 lb

## 2016-09-11 DIAGNOSIS — T148 Other injury of unspecified body region: Secondary | ICD-10-CM

## 2016-09-11 DIAGNOSIS — IMO0002 Reserved for concepts with insufficient information to code with codable children: Secondary | ICD-10-CM

## 2016-09-11 MED ORDER — METHYLPREDNISOLONE SODIUM SUCC 125 MG IJ SOLR
125.0000 mg | Freq: Once | INTRAMUSCULAR | Status: AC
  Administered 2016-09-11: 125 mg via INTRAMUSCULAR

## 2016-09-11 MED ORDER — CYCLOBENZAPRINE HCL 5 MG PO TABS: 5.0000 mg | ORAL_TABLET | Freq: Three times a day (TID) | ORAL | 0 refills | Status: DC | PRN

## 2016-09-11 MED ORDER — KETOROLAC TROMETHAMINE 30 MG/ML IJ SOLN
30.0000 mg | Freq: Once | INTRAMUSCULAR | Status: AC
  Administered 2016-09-11: 30 mg via INTRAMUSCULAR

## 2016-09-11 MED ORDER — OXYCODONE-ACETAMINOPHEN 5-325 MG PO TABS: 1.0000 | ORAL_TABLET | Freq: Three times a day (TID) | ORAL | 0 refills | Status: DC | PRN

## 2016-09-11 NOTE — Patient Instructions (Signed)
It was so nice to see you!  We have given you two shots for pain today. I have also written a prescription for Flexeril. You should take this three times a day as needed. You should also take Ibuprofen three times a day. I have refilled the Percocet. You should ONLY take this as needed if your pain is not relieved by the other medications.  Please come back to have a flu shot done.  -Dr. Nancy MarusMayo
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
## 2016-09-19 ENCOUNTER — Ambulatory Visit (INDEPENDENT_AMBULATORY_CARE_PROVIDER_SITE_OTHER): Payer: Medicaid Other | Admitting: Family Medicine

## 2016-09-19 ENCOUNTER — Encounter: Payer: Self-pay | Admitting: Family Medicine

## 2016-09-19 VITALS — BP 117/71 | HR 68 | Temp 97.7°F | Ht 66.0 in | Wt 132.0 lb

## 2016-09-19 DIAGNOSIS — R21 Rash and other nonspecific skin eruption: Secondary | ICD-10-CM | POA: Diagnosis not present

## 2016-09-19 DIAGNOSIS — B360 Pityriasis versicolor: Secondary | ICD-10-CM

## 2016-09-19 NOTE — Progress Notes (Signed)
   Subjective:    Patient ID: Todd Miller , male   DOB: [birth year] , 30 y.o..   MRN: [id number]  HPI  [facility] Khaled Miller is here for rash.  Anders Simmondshristina Gambino, MD [facility], PGY-2 RASH  Location: neck, chest, and back  Onset: 1 week ago   Course: has been getting progressively worse Self-treated with: nothing              Improvement with treatment: none tried  History Pruritis: no  Tenderness: no  New medications/antibiotics: no. Started Roxicet June 6th Tick/insect/pet exposure: no  Recent travel: no  New detergent, new clothing, or other topical exposure: no   Red Flags Feeling ill: no  Fever: no  Mouth lesions: no  Facial/tongue swelling/difficulty breathing:  no  Diabetic or immunocompromised: no    Review of Systems: Per HPI. All other systems reviewed and are negative.   Social Hx:  reports that he has been smoking Pipe.  He does not have any smokeless tobacco history on file.    Objective:   BP 117/71   Pulse 68   Temp 97.7 F (36.5 C) (Oral)   Ht 5\' 6"  (1.676 m)   Wt 132 lb (59.9 kg)   BMI 21.31 kg/m  Physical Exam  Gen: NAD, alert, cooperative with exam, well-appearing HEENT: NCAT, PERRL, clear conjunctiva, oropharynx clear, supple neck Cardiac: Regular rate and rhythm, normal S1/S2, no murmur, no edema, capillary refill brisk  Respiratory: Clear to auscultation bilaterally, no wheezes, non-labored breathing Gastrointestinal: soft, non tender, non distended, bowel sounds present Skin: Diffuse hyperpigmented macular rash on chest and back, slightly dry (see pictures)     .Skin KOH;  Positive  Assessment & Plan:  Tinea versicolor Rash most consistent with tinea versicolor. 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 - Patient will apply selsum blue shampoo to affected areas on dry skin and leave for 10 minutes. Then wash shampoo off body. Will do this once daily until rash resolves. - If rash does not go away with this  treatment and patient returns, can consider antifungal cream  - Discussed washing all clothes and linens in warm/hot soapy water  - Return precautions discussed

## 2016-09-19 NOTE — Patient Instructions (Addendum)
Thank you for coming in today, it was so nice to see you! Today we talked about:    Fungus: The treatment for this is a shampoo. It is called "Selsun Blue". You apply this shampoo to entire skin for 10 minutes. Then rinse off in the shower. Do this every day until to rash goes away.   Please follow up in 2 weeks if this does not get better.   If you have any questions or concerns, please do not hesitate to call the office at [phone]. You can also message me directly via MyChart.   Sincerely,  Anders Simmondshristina Candace Ramus, MD

## 2016-09-23 ENCOUNTER — Telehealth: Payer: Self-pay

## 2016-09-23 LAB — POCT SKIN KOH: Skin KOH, POC: POSITIVE
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
## 2016-09-28 DIAGNOSIS — B360 Pityriasis versicolor: Secondary | ICD-10-CM | POA: Insufficient documentation

## 2016-09-28 NOTE — Assessment & Plan Note (Signed)
Rash most consistent with tinea versicolor. 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 - Patient will apply selsum blue shampoo to affected areas on dry skin and leave for 10 minutes. Then wash shampoo off body. Will do this once daily until rash resolves. - If rash does not go away with this treatment and patient returns, can consider antifungal cream  - Discussed washing all clothes and linens in warm/hot soapy water  - Return precautions discussed

## 2016-10-01 ENCOUNTER — Ambulatory Visit (INDEPENDENT_AMBULATORY_CARE_PROVIDER_SITE_OTHER): Payer: Medicaid Other | Admitting: Family Medicine

## 2016-10-01 VITALS — BP 120/78 | HR 74 | Temp 98.3°F | Ht 66.0 in | Wt 133.0 lb

## 2016-10-01 DIAGNOSIS — S22000D Wedge compression fracture of unspecified thoracic vertebra, subsequent encounter for fracture with routine healing: Secondary | ICD-10-CM

## 2016-10-01 MED ORDER — IBUPROFEN 800 MG PO TABS: 800.0000 mg | ORAL_TABLET | Freq: Three times a day (TID) | ORAL | 0 refills | Status: DC | PRN

## 2016-10-01 MED ORDER — OXYCODONE-ACETAMINOPHEN 5-325 MG PO TABS: 1.0000 | ORAL_TABLET | Freq: Three times a day (TID) | ORAL | 0 refills | Status: DC | PRN

## 2016-10-01 NOTE — Progress Notes (Signed)
   HPI  CC: Back pain  Todd Miller is a 30 y.o. , with the above CC.  Has known compression fracture of T12.  Pain is described as: sharp pain at fracture site and radiates to the legs, bilaterally. Morning is at it's worse with pain. Attempted to do physical therapy, but was told it was not covered by medicaid.  Cannot sleep at night due to the pain.  Pain medication (Percocet) has helped patient, but he has run out of meds, none of other meds help. Denies loss of bladder or bowel function. States his erections are not as good as before, but still able to sustain.     The following portions of the patient's history were reviewed and updated as appropriate: allergies, current medications, past family history, past medical history, past social history, past surgical history and problem list.  ROS: A 12-point review of systems was performed and negative, except as stated in the above HPI.  OBJECTIVE BP 120/78   Pulse 74   Temp 98.3 F (36.8 C) (Oral)   Ht 5\' 6"  (1.676 m)   Wt 133 lb (60.3 kg)   SpO2 98%   BMI 21.47 kg/m  Gen: NAD, alert, cooperative, and pleasant. HEENT: NCAT, EOMI, PERRL CV: RRR, no murmur Resp: CTAB, no wheezes, non-labored Abd: SNTND, BS present, no guarding or organomegaly Ext: No edema, warm Neuro: Alert and oriented, Speech clear, No gross deficits. Reports decreased sensation around saddle area, however cremasteric reflex intact noted.    ASSESSMENT AND PLAN: 1. Closed compression fracture of thoracic vertebra with routine healing, subsequent encounter - Ambulatory referral to Neurosurgery - MR Lumbar Spine Wo Contrast; Future - oxyCODONE-acetaminophen (ROXICET) 5-325 MG tablet; Take 1 tablet by mouth every 8 (eight) hours as needed for severe pain.  Dispense: 20 tablet; Refill: 0 - ibuprofen (ADVIL,MOTRIN) 800 MG tablet; Take 1 tablet (800 mg total) by mouth every 8 (eight) hours as needed.  Dispense: 21 tablet; Refill: 0    Todd ClarksElizabeth W  Deloss Amico, DO [facility] 10/01/2016 3:38 PM

## 2016-10-01 NOTE — Patient Instructions (Signed)

## 2016-10-02 ENCOUNTER — Encounter: Payer: Medicaid Other | Attending: Physical Medicine & Rehabilitation

## 2016-10-02 ENCOUNTER — Ambulatory Visit (HOSPITAL_BASED_OUTPATIENT_CLINIC_OR_DEPARTMENT_OTHER): Payer: Medicaid Other | Admitting: Physical Medicine & Rehabilitation

## 2016-10-02 ENCOUNTER — Encounter: Payer: Self-pay | Admitting: Physical Medicine & Rehabilitation

## 2016-10-02 VITALS — BP 112/78 | HR 69 | Resp 14

## 2016-10-02 DIAGNOSIS — G8929 Other chronic pain: Secondary | ICD-10-CM | POA: Diagnosis not present

## 2016-10-02 DIAGNOSIS — M545 Low back pain: Secondary | ICD-10-CM | POA: Insufficient documentation

## 2016-10-02 DIAGNOSIS — F17200 Nicotine dependence, unspecified, uncomplicated: Secondary | ICD-10-CM | POA: Insufficient documentation

## 2016-10-02 NOTE — Patient Instructions (Signed)
Non medication pain relief  Try ice 20min alternating with heating pad for 20 minutes every 2 hours as needed  Try TENs unit that can be puchased in a pharmacy for about 40.00, brands include Aleve or Icy Hot  Try various muscle cremes  Aspercreme or others that contain trolamine- this is anti inflammatory  Capsaicin creme which reduces Pain substance P  Lidocaine containing creme which has a numbing medicine  Methol and camphor containing creme which has a cooling effect 
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
## 2016-10-06 ENCOUNTER — Ambulatory Visit (INDEPENDENT_AMBULATORY_CARE_PROVIDER_SITE_OTHER): Payer: Medicaid Other | Admitting: Internal Medicine

## 2016-10-06 ENCOUNTER — Encounter: Payer: Self-pay | Admitting: Internal Medicine

## 2016-10-06 VITALS — BP 117/85 | Temp 97.8°F | Wt 133.0 lb

## 2016-10-06 DIAGNOSIS — R200 Anesthesia of skin: Secondary | ICD-10-CM

## 2016-10-06 DIAGNOSIS — M533 Sacrococcygeal disorders, not elsewhere classified: Secondary | ICD-10-CM | POA: Diagnosis present

## 2016-10-06 DIAGNOSIS — M545 Low back pain: Secondary | ICD-10-CM | POA: Diagnosis not present

## 2016-10-06 MED ORDER — GABAPENTIN 100 MG PO CAPS: ORAL_CAPSULE | ORAL | 0 refills | Status: DC

## 2016-10-06 NOTE — Patient Instructions (Signed)
I prescribed Gabapentin. Please start by taking 1 tablet at night. You can go up to 1 tablet three times a day.   [facility]: [facility]

## 2016-10-07 ENCOUNTER — Other Ambulatory Visit (HOSPITAL_COMMUNITY): Payer: Medicaid Other

## 2016-10-10 NOTE — Telephone Encounter (Signed)
Pt had appointment with Dr. Ottie GlazierGunadasa on 10/07/2015. Sunday SpillersSharon T Luka Stohr, CMA

## 2016-10-14 ENCOUNTER — Encounter: Payer: Self-pay | Admitting: Student

## 2016-10-14 ENCOUNTER — Ambulatory Visit (INDEPENDENT_AMBULATORY_CARE_PROVIDER_SITE_OTHER): Payer: Medicaid Other | Admitting: Student

## 2016-10-14 DIAGNOSIS — M545 Low back pain: Secondary | ICD-10-CM | POA: Diagnosis not present

## 2016-10-14 MED ORDER — GABAPENTIN 100 MG PO CAPS
200.0000 mg | ORAL_CAPSULE | Freq: Three times a day (TID) | ORAL | 0 refills | Status: DC
Start: 2016-10-14 — End: 2017-02-14

## 2016-10-14 NOTE — Assessment & Plan Note (Signed)
Continued back pain, without red flag symptoms. MRI ordered by PCP with specific indication for LE numbness - patient to obtain this MRI - increased gabapentin to 200 TID - continue Robaxin as needed

## 2016-10-14 NOTE — Patient Instructions (Signed)
Follow with PCP in 2 weeks ( please make a 30 min appointment) Please take 200 mg Neurontin three times per day Discuss your MRI with your PCP Call the office at [phone] with questions
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
## 2016-10-15 ENCOUNTER — Ambulatory Visit (HOSPITAL_COMMUNITY): Admission: RE | Admit: 2016-10-15 | Payer: Medicaid Other | Source: Ambulatory Visit
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
## 2016-10-30 ENCOUNTER — Ambulatory Visit: Payer: Medicaid Other | Admitting: Internal Medicine

## 2016-11-03 ENCOUNTER — Encounter: Payer: Self-pay | Admitting: Family Medicine

## 2016-12-12 ENCOUNTER — Encounter: Payer: Self-pay | Admitting: Internal Medicine

## 2016-12-12 ENCOUNTER — Ambulatory Visit (INDEPENDENT_AMBULATORY_CARE_PROVIDER_SITE_OTHER): Payer: Medicaid Other | Admitting: Internal Medicine

## 2016-12-12 VITALS — BP 124/82 | HR 58 | Temp 98.6°F | Ht 66.0 in | Wt 134.4 lb

## 2016-12-12 DIAGNOSIS — F329 Major depressive disorder, single episode, unspecified: Secondary | ICD-10-CM | POA: Diagnosis present

## 2016-12-12 DIAGNOSIS — J029 Acute pharyngitis, unspecified: Secondary | ICD-10-CM

## 2016-12-12 DIAGNOSIS — F32A Depression, unspecified: Secondary | ICD-10-CM

## 2016-12-12 LAB — POCT RAPID STREP A (OFFICE): Rapid Strep A Screen: NEGATIVE

## 2016-12-12 MED ORDER — SERTRALINE HCL 25 MG PO TABS: 25.0000 mg | ORAL_TABLET | Freq: Every day | ORAL | 1 refills | Status: DC

## 2016-12-12 NOTE — Progress Notes (Signed)
   [facility] Phone: [phone]   Date of Visit: 12/12/2016   HPI: Arabic interpreter used for visit #140001  Depression:  - reports that he is in a "bad psychological condition" - "life is big stress" and feels like he does not want to do anything - reports issues with intimate relationship - he gets irritated/angry  - reports of "problems" with wife and kids  - feels depressed  - reports of symptoms for 1 year and 2 months (since moving to the [place]) but seemed to have worsened in the past 1-2 months - reports that he cannot focus  - no SI or HI - no hallucinations  - reports that he was on the medication which helped him feel better. He does not know why he stopped taking. We discussed that this is a type of medication that he should continue to take if it helps with his symptoms.    Reports that he has a sore throat. We did not have time to discuss this unfortunately. His POC strep was negative.   ROS: See HPI.  PMFSH:  Anxiety State  PHYSICAL EXAM: BP 124/82 (BP Location: Right Arm, Patient Position: Sitting, Cuff Size: Normal)   Pulse (!) 58   Temp 98.6 F (37 C) (Oral)   Ht 5\' 6"  (1.676 m)   Wt 134 lb 6.4 oz (61 kg)   SpO2 98%   BMI 21.69 kg/m  GEN: NAD HEENT: neck supple, no cervical lymphadenopathy  CV: RRR, no murmurs, rubs, or gallops PULM: CTAB, normal effort PSYCH: Mood and affect euthymic, normal rate and volume of speech NEURO: Awake, alert, no focal deficits grossly, normal speech   ASSESSMENT/PLAN:  Sore throat: did not have time to discuss this fully as we discussed the issue noted below and patient was 20 minutes late for his appointment.  - Rapid Strep A: negative - follow up to discuss if he continues to have symptoms  2. Depression, unspecified depression type - restart Zoloft - discussed behavioral healthy therapy. Patient would like to see how medication works first - follow up in 1 month   Palma HolterKanishka G Gunadasa,  MD PGY 2 [facility]

## 2017-01-05 NOTE — Progress Notes (Deleted)
   [facility] Phone: [phone]   Date of Visit: 01/06/2017   HPI:  ***  ROS: See HPI.  PMFSH:  PMH:  History of Thoracic Compression Fracture Anxiety State Tobacco Use Anemia Hx Vitamin D deficiency    PHYSICAL EXAM: There were no vitals taken for this visit. Gen: *** HEENT: *** Heart: *** Lungs: *** Neuro: *** Ext: ***  ASSESSMENT/PLAN:  Health maintenance:  -***  No problem-specific Assessment & Plan notes found for this encounter.  FOLLOW UP: Follow up in *** for ***  Palma HolterKanishka G Gunadasa, MD PGY 2 [facility]

## 2017-01-06 ENCOUNTER — Ambulatory Visit: Payer: Medicaid Other | Admitting: Internal Medicine

## 2017-01-12 ENCOUNTER — Ambulatory Visit: Payer: Medicaid Other | Admitting: Internal Medicine

## 2017-01-13 ENCOUNTER — Encounter: Payer: Self-pay | Admitting: Family Medicine

## 2017-01-13 ENCOUNTER — Ambulatory Visit (INDEPENDENT_AMBULATORY_CARE_PROVIDER_SITE_OTHER): Payer: Medicaid Other | Admitting: Family Medicine

## 2017-01-13 VITALS — BP 108/72 | HR 63 | Temp 98.0°F | Ht 66.0 in | Wt 134.8 lb

## 2017-01-13 DIAGNOSIS — G8929 Other chronic pain: Secondary | ICD-10-CM

## 2017-01-13 DIAGNOSIS — M546 Pain in thoracic spine: Secondary | ICD-10-CM | POA: Diagnosis not present

## 2017-01-13 DIAGNOSIS — S22000D Wedge compression fracture of unspecified thoracic vertebra, subsequent encounter for fracture with routine healing: Secondary | ICD-10-CM
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
## 2017-01-13 NOTE — Patient Instructions (Signed)
We need to get the MRI for your back.  We will make a plan for you at that point.  Take the Ultracet for pain relief every 6 - 8 hours if you need it.    It was good to see you again today

## 2017-01-14 NOTE — Assessment & Plan Note (Addendum)
Still with ongoing pain. He states that his Medicaid would not pay for his MRI for some reason. However we have scheduled his MRI at this appointment. -Treating with Ultracet as pain relief. Do not note any muscle spasm.  Will make further treatment decisions based on results of MRI. Would have expected compression fracture to have healed by now, this is reason for checking due to degree of pain.

## 2017-01-20 ENCOUNTER — Ambulatory Visit (HOSPITAL_COMMUNITY)
Admission: RE | Admit: 2017-01-20 | Discharge: 2017-01-20 | Disposition: A | Discharge status: Home / Self Care | Payer: Medicaid Other | Source: Ambulatory Visit | Attending: Family Medicine | Admitting: Family Medicine

## 2017-01-20 DIAGNOSIS — G8929 Other chronic pain: Secondary | ICD-10-CM

## 2017-01-20 DIAGNOSIS — M546 Pain in thoracic spine: Secondary | ICD-10-CM

## 2017-01-20 DIAGNOSIS — M5144 Schmorl's nodes, thoracic region: Secondary | ICD-10-CM | POA: Insufficient documentation

## 2017-01-20 DIAGNOSIS — M5124 Other intervertebral disc displacement, thoracic region: Secondary | ICD-10-CM | POA: Diagnosis not present

## 2017-01-20 DIAGNOSIS — M5126 Other intervertebral disc displacement, lumbar region: Secondary | ICD-10-CM | POA: Diagnosis not present

## 2017-01-28 ENCOUNTER — Other Ambulatory Visit: Payer: Self-pay | Admitting: Internal Medicine

## 2017-01-28 NOTE — Telephone Encounter (Signed)
Refill request for Tramadol 

## 2017-01-29 ENCOUNTER — Other Ambulatory Visit: Payer: Self-pay | Admitting: Internal Medicine

## 2017-01-29 MED ORDER — NAPROXEN 250 MG PO TABS: 250.0000 mg | ORAL_TABLET | Freq: Three times a day (TID) | ORAL | 0 refills | Status: DC

## 2017-01-29 NOTE — Telephone Encounter (Signed)
Arabic Interpreter: [phone]  Discussed with patient regarding his MRI. Patient requested refill of Tramadol. I dicussed with Preceptor; there is no indication to continue Tramadol or opioid medication currently. Recommend Naproxen PRN. After much discussed, patient agreed to try Naproxen. Offered referral to PT, but patient declined. Offered referral to Ortho for possible spinal steroid injection but patient declined and would like to see Dr. Gwendolyn GrantWalden for his back pain. Has an appointment on 2/20. Discussed taking Naproxen with food and to stop if has stomach irritation. Patient understood. Questions answered.

## 2017-01-29 NOTE — Telephone Encounter (Signed)
Pt called because he is out of all pain medication. He really needs the refill on his Tramadol. jw

## 2017-01-30 ENCOUNTER — Encounter: Payer: Self-pay | Admitting: Family Medicine

## 2017-02-10 ENCOUNTER — Ambulatory Visit: Payer: Medicaid Other | Admitting: Internal Medicine

## 2017-02-10 ENCOUNTER — Ambulatory Visit: Payer: Medicaid Other | Admitting: Family Medicine

## 2017-02-13 ENCOUNTER — Encounter: Payer: Self-pay | Admitting: Internal Medicine

## 2017-02-13 ENCOUNTER — Ambulatory Visit (INDEPENDENT_AMBULATORY_CARE_PROVIDER_SITE_OTHER): Payer: Medicaid Other | Admitting: Internal Medicine

## 2017-02-13 VITALS — BP 114/68 | HR 66 | Temp 97.9°F | Wt 134.4 lb

## 2017-02-13 DIAGNOSIS — M549 Dorsalgia, unspecified: Secondary | ICD-10-CM

## 2017-02-13 DIAGNOSIS — G8929 Other chronic pain: Secondary | ICD-10-CM | POA: Diagnosis not present

## 2017-02-13 MED ORDER — DULOXETINE HCL 30 MG PO CPEP: 30.0000 mg | ORAL_CAPSULE | Freq: Every day | ORAL | 0 refills | Status: DC
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
## 2017-02-13 NOTE — Patient Instructions (Addendum)
Please take  CYMBALTA 1 tablet a day Please follow up in 2 weeks Please bring all of your medications.

## 2017-02-20 ENCOUNTER — Other Ambulatory Visit: Payer: Self-pay | Admitting: Internal Medicine

## 2017-02-20 NOTE — Progress Notes (Signed)
Patient requested letter with summary of his back pain. Letter printed and placed at front desk for pick up. Please inform patient.

## 2017-02-23 NOTE — Progress Notes (Signed)
[employer] interpreter used: mohammed [phone], Patient is aware that letter is ready for pick up and also would like to know if he can get another medication for his back pain.  He had to stop the cymbalta after 3 days of taking it due to not working with his body.  Advised him that I would check with MD but that he would need to keep his appointment on 03-03-17 with Dr. Gwendolyn GrantWalden.  Ayan Heffington,CMA

## 2017-02-24 ENCOUNTER — Telehealth: Payer: Self-pay

## 2017-02-24 MED ORDER — TRAMADOL-ACETAMINOPHEN 37.5-325 MG PO TABS: 1.0000 | ORAL_TABLET | Freq: Three times a day (TID) | ORAL | 0 refills | Status: DC | PRN

## 2017-02-24 NOTE — Progress Notes (Addendum)
Usually it takes a little while to see the effects of Cymbalta. Jazmine, I have ordered Ultracet 37.5-325mg  and placed under "phone in. Could you please call in this prescription for patient. It will be: take 1 tab by mouth every 8 hours PRN for severe pain. Dispense 20 tab, no refill. (thank you, I am not in clinic today). Yes, patient needs to keep his appointment with Dr. Gwendolyn GrantWalden. Please let patient know.

## 2017-02-24 NOTE — Addendum Note (Signed)
Addended by: [name], KANISHKA G on: 02/24/2017 01:22 PM   Modules accepted: Orders

## 2017-02-24 NOTE — Telephone Encounter (Signed)
Ultracet 37.5-325mg  tabs called into [facility] pharmacy. Verified with Pharmacist.

## 2017-02-26 ENCOUNTER — Ambulatory Visit: Payer: Medicaid Other | Admitting: Internal Medicine

## 2017-02-26 NOTE — Progress Notes (Deleted)
   [facility] Phone: [phone]   Date of Visit: 02/26/2017   HPI:  ***  ROS: See HPI.  PMFSH: ***  PHYSICAL EXAM: There were no vitals taken for this visit. Gen: *** HEENT: *** Heart: *** Lungs: *** Neuro: *** Ext: ***  ASSESSMENT/PLAN:  Health maintenance:  -***  No problem-specific Assessment & Plan notes found for this encounter.  FOLLOW UP: Follow up in *** for ***  Palma HolterKanishka G Gunadasa, MD PGY 2 [facility]

## 2017-03-03 ENCOUNTER — Encounter: Payer: Self-pay | Admitting: Family Medicine

## 2017-03-03 ENCOUNTER — Ambulatory Visit (INDEPENDENT_AMBULATORY_CARE_PROVIDER_SITE_OTHER): Payer: Self-pay | Admitting: Family Medicine

## 2017-03-03 DIAGNOSIS — S22000D Wedge compression fracture of unspecified thoracic vertebra, subsequent encounter for fracture with routine healing: Secondary | ICD-10-CM

## 2017-03-03 MED ORDER — TRAMADOL-ACETAMINOPHEN 37.5-325 MG PO TABS: 1.0000 | ORAL_TABLET | Freq: Three times a day (TID) | ORAL | 0 refills | Status: DC | PRN

## 2017-03-03 MED ORDER — BACLOFEN 10 MG PO TABS: 10.0000 mg | ORAL_TABLET | Freq: Every evening | ORAL | 0 refills | Status: DC | PRN
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
## 2017-03-03 NOTE — Patient Instructions (Signed)
We will refer you to physical therapy.  Like we talked about, this is the best treatment for back pain.    Take the Tramadol for pain relief.  You can also try Baclofen as a muscle relaxer in the evening to help with sleep.    It was good to see you today!

## 2017-03-03 NOTE — Assessment & Plan Note (Addendum)
I did check the [place] Controlled Substance Database.  Patient had 1 script for oxycodone in September, and 3 prescriptions for Tramadol since then (6 months).  Never more than 30 pills. - Does have poor muscle mass in legs and lower back.   - I believe he would definitely benefit from physical therapy. As noted in history of present illness he has Medicaid. However patient would like to be billed for physical therapy and will handle the bills himself. I think he would likely also benefit from a TENS unit. -I have given him another prescription for tramadol for pain relief. I have also prescribed him baclofen as a muscle relaxer. -I am referring him back to physical therapy today for further evaluation and treatment.

## 2017-04-14 ENCOUNTER — Ambulatory Visit (INDEPENDENT_AMBULATORY_CARE_PROVIDER_SITE_OTHER): Payer: Medicaid Other | Admitting: Family Medicine

## 2017-04-14 ENCOUNTER — Encounter: Payer: Self-pay | Admitting: Family Medicine

## 2017-04-14 VITALS — BP 104/70 | HR 62 | Temp 97.8°F | Ht 66.0 in | Wt 131.8 lb

## 2017-04-14 DIAGNOSIS — G8929 Other chronic pain: Secondary | ICD-10-CM | POA: Diagnosis not present

## 2017-04-14 DIAGNOSIS — S22000D Wedge compression fracture of unspecified thoracic vertebra, subsequent encounter for fracture with routine healing: Secondary | ICD-10-CM

## 2017-04-14 DIAGNOSIS — M545 Low back pain, unspecified: Secondary | ICD-10-CM

## 2017-04-14 MED ORDER — TRAMADOL-ACETAMINOPHEN 37.5-325 MG PO TABS: 1.0000 | ORAL_TABLET | Freq: Three times a day (TID) | ORAL | 1 refills | Status: DC | PRN

## 2017-04-14 NOTE — Patient Instructions (Signed)
It was good to see you again today.  Take the tramadol for pain relief.  Wear the back brace during the day to help with your back pain.  Use a heating pad to help the muscles relax. Massage or having someone rub the area will also help with this.  I will call the physical therapist.
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
## 2017-04-19 NOTE — Assessment & Plan Note (Signed)
Still with ongoing pain.  He would like to attend PT -- even if he has to pay out of pocket.  Will refer today.  Refill for Ultracet for pain relief.  Baclofen as muscle relaxer.   PRN ibuprofen for anti-inflammatory effects He has not tried heat or massage.  He will try this.   Wanted some form of brace, discussed this with him.  No real evidence of benefit, but he would like to try anyway.

## 2017-04-28 ENCOUNTER — Ambulatory Visit: Payer: Medicaid Other | Attending: Family Medicine

## 2017-04-28 DIAGNOSIS — G8929 Other chronic pain: Secondary | ICD-10-CM | POA: Diagnosis present

## 2017-04-28 DIAGNOSIS — M6281 Muscle weakness (generalized): Secondary | ICD-10-CM | POA: Insufficient documentation

## 2017-04-28 DIAGNOSIS — M62830 Muscle spasm of back: Secondary | ICD-10-CM | POA: Diagnosis present

## 2017-04-28 DIAGNOSIS — R29898 Other symptoms and signs involving the musculoskeletal system: Secondary | ICD-10-CM | POA: Insufficient documentation

## 2017-04-28 DIAGNOSIS — M545 Low back pain: Secondary | ICD-10-CM | POA: Insufficient documentation
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
## 2017-05-12 ENCOUNTER — Ambulatory Visit: Payer: Medicaid Other

## 2017-05-12 DIAGNOSIS — M6281 Muscle weakness (generalized): Secondary | ICD-10-CM

## 2017-05-12 DIAGNOSIS — M545 Low back pain, unspecified: Secondary | ICD-10-CM

## 2017-05-12 DIAGNOSIS — R29898 Other symptoms and signs involving the musculoskeletal system: Secondary | ICD-10-CM

## 2017-05-12 DIAGNOSIS — G8929 Other chronic pain: Secondary | ICD-10-CM

## 2017-05-12 DIAGNOSIS — M62830 Muscle spasm of back: Secondary | ICD-10-CM

## 2017-05-12 NOTE — Therapy (Signed)
[facility] [address] [place], [place], [address] Phone: [phone]   Fax:  [phone]  Physical Therapy Treatment  Patient Details  Name: Todd Miller Todd Miller MRN: [id number] Date of Birth: Oct 24, 1986 Referring Provider: Payton MccallumJeffrey Walden ,MD  Encounter Date: 05/12/2017      PT End of Session - 05/12/17 1104    Visit Number 2   Number of Visits 12   Date for PT Re-Evaluation 06/05/17   Authorization Type self pay   PT Start Time 1100   PT Stop Time 1150   PT Time Calculation (min) 50 min   Activity Tolerance Patient tolerated treatment well;Patient limited by pain   Behavior During Therapy [facility] for tasks assessed/performed      History reviewed. No pertinent past medical history.  Past Surgical History:  Procedure Laterality Date  . HAND SURGERY      There were no vitals filed for this visit.      Subjective Assessment - 05/12/17 1107    Subjective He reports pain is less but when on feet has pain    Patient is accompained by: Interpreter   Currently in Pain? Yes   Pain Score 6    Pain Location Back   Pain Orientation Lower   Pain Descriptors / Indicators Sharp   Pain Type Chronic pain   Pain Onset More than a month ago   Pain Frequency Constant                         [facility] Adult PT Treatment/Exercise - 05/12/17 0001      Lumbar Exercises: Stretches   Double Knee to Chest Stretch 2 reps;30 seconds   Pelvic Tilt Limitations 10 rps 10 sec   Prone on Elbows Stretch 1 rep;60 seconds   Press Ups 5 reps   Press Ups Limitations 5 sec     Lumbar Exercises: Aerobic   Stationary Bike Nustep L4 UE and LE      Lumbar Exercises: Quadruped   Other Quadruped Lumbar Exercises prayer stretch 2x30 sec and 30 sec to rT and LT     Modalities   Modalities Moist Heat     Moist Heat Therapy   Number Minutes Moist Heat 12 Minutes   Moist Heat Location Lumbar Spine     Manual Therapy   Manual  Therapy Joint mobilization;Soft tissue mobilization   Joint Mobilization PA glides L5 to T10 central gr 2-3 and STW to paraspinals  followed by leg pulls and  quadraped stretching with manual overpressure.  60 sec                 PT Education - 05/12/17 1138    Education provided Yes   Education Details HEP    Person(s) Educated Patient   Methods Explanation;Tactile cues;Verbal cues;Handout   Comprehension Verbalized understanding;Returned demonstration          PT Short Term Goals - 04/28/17 1145      PT SHORT TERM GOAL #1   Title Pt will be able to demonstrate HEP/postural correction following the pictures on the handout.   Time 2   Period Weeks   Status New     PT SHORT TERM GOAL #2   Title He will report pain 30% or more improved.    Time 3   Period Weeks   Status New     PT SHORT TERM GOAL #3   Title He will improve trunk flexion to 50 degrees  to demo improved ROM   Time 3   Period Weeks   Status New           PT Long Term Goals - 04/28/17 1202      PT LONG TERM GOAL #1   Title He will be independent with all HEP issued   Time 6   Period Weeks   Status New     PT LONG TERM GOAL #2   Title He will have no pain with ROM of LE   Time 6   Period Weeks   Status New     PT LONG TERM GOAL #3   Title He will be able to lift 20-25 pounds with 1-2 max pain.    Time 6   Period Weeks   Status New     PT LONG TERM GOAL #4   Title He will improve trunk side bending to 25 degrees to decr pain with movement   Time 6   Period Weeks   Status New     PT LONG TERM GOAL #5   Title He will return to previous home tasks with 1-2 max pain   Time 6   Period Weeks   Status New     Additional Long Term Goals   Additional Long Term Goals Yes     PT LONG TERM GOAL #6   Title He will be able to lye on stomach for 30 min before incr pain    Time 6   Period Weeks   Status New               Plan - 05/12/17 1108    Clinical Impression Statement  Movement patterns are normal. Tender to pressure to spine an stiffness. Did initial HEP correctly after instruction.  Expect some pain with exercise.     PT Treatment/Interventions Electrical Stimulation;Iontophoresis 4mg /ml Dexamethasone;Moist Heat;Therapeutic exercise;Patient/family education;Passive range of motion;Manual techniques;Therapeutic activities   PT Next Visit Plan review  HEP stretching ,  Manual stretching and STW.  core strength   PT Home Exercise Plan stretching Knee to chest, post pelvic tilt, extension spine, bridge,     Consulted and Agree with Plan of Care Patient      Patient will benefit from skilled therapeutic intervention in order to improve the following deficits and impairments:  Pain, Postural dysfunction, Decreased range of motion, Decreased activity tolerance, Increased muscle spasms  Visit Diagnosis: Chronic bilateral low back pain without sciatica  Muscle spasm of back  Other symptoms and signs involving the musculoskeletal system  Muscle weakness (generalized)     Problem List Patient Active Problem List   Diagnosis Date Noted  . Tinea versicolor 09/28/2016  . Thoracic compression fracture (HCC) 07/17/2016  . Eye pain 07/17/2016  . Poor appetite 04/30/2016  . Globus sensation 04/18/2016  . Anemia 02/19/2016  . Vitamin D deficiency 02/19/2016  . Urinary frequency 01/23/2016  . Anxiety state 01/23/2016  . Refugee health examination 01/23/2016  . Tobacco use disorder 01/23/2016    Caprice Red  PT 05/12/2017, 11:43 AM  [facility] [address] [place], [place], [address] Phone: [phone]   Fax:  [phone]  Name: Todd Miller MRN: [id number] Date of Birth: June 15, 1986

## 2017-05-12 NOTE — Patient Instructions (Signed)
Issued from cabinet Williams L1 exercises , Mckenzie on elbow and press up, bridge, prayer stretch. 2x/day  10-30 sec 2-3 reps for stretch and 2x/day 10-15 reps for strength

## 2017-05-14 ENCOUNTER — Ambulatory Visit: Payer: Medicaid Other

## 2017-05-19 ENCOUNTER — Ambulatory Visit: Payer: Medicaid Other | Admitting: Physical Therapy

## 2017-05-19 DIAGNOSIS — M545 Low back pain, unspecified: Secondary | ICD-10-CM

## 2017-05-19 DIAGNOSIS — R29898 Other symptoms and signs involving the musculoskeletal system: Secondary | ICD-10-CM

## 2017-05-19 DIAGNOSIS — G8929 Other chronic pain: Secondary | ICD-10-CM

## 2017-05-19 DIAGNOSIS — M62830 Muscle spasm of back: Secondary | ICD-10-CM

## 2017-05-19 DIAGNOSIS — M6281 Muscle weakness (generalized): Secondary | ICD-10-CM

## 2017-05-19 NOTE — Therapy (Signed)
[facility] [address] [place], [place], [address] Phone: [phone]   Fax:  [phone]  Physical Therapy Treatment  Patient Details  Name: Todd Miller MRN: [id number] Date of Birth: [birth year] Referring Provider: Payton MccallumJeffrey Walden ,MD  Encounter Date: 05/19/2017      PT End of Session - 05/19/17 1419    Visit Number 2   Number of Visits 12   Date for PT Re-Evaluation 06/05/17   Authorization Type self pay   PT Start Time 0215   PT Stop Time 0300   PT Time Calculation (min) 45 min      No past medical history on file.  Past Surgical History:  Procedure Laterality Date  . HAND SURGERY      There were no vitals filed for this visit.      Subjective Assessment - 05/19/17 1417    Subjective Pain is a little better.    Patient is accompained by: Interpreter  Virgel BouquetJameel Ali, [employer]   Currently in Pain? Yes   Pain Score 5    Pain Location Back   Pain Orientation Lower   Pain Descriptors / Indicators --  light   Aggravating Factors  standing prolonged, working hard    Pain Relieving Factors sitting, laying down                          [facility] Adult PT Treatment/Exercise - 05/19/17 0001      Lumbar Exercises: Stretches   Active Hamstring Stretch 2 reps;30 seconds   Active Hamstring Stretch Limitations strap   Double Knee to Chest Stretch 2 reps;30 seconds   Lower Trunk Rotation 10 seconds   Prone on Elbows Stretch 1 rep;60 seconds   Piriformis Stretch 2 reps;30 seconds     Lumbar Exercises: Supine   Bent Knee Raise Limitations Table top alternating foot taps    Bridge 10 reps   Bridge Limitations 1 set wtih ball squeeze    Straight Leg Raise 10 reps   Straight Leg Raises Limitations with ab set, 2 sets      Lumbar Exercises: Quadruped   Opposite Arm/Leg Raise 10 reps   Opposite Arm/Leg Raise Limitations cues for abdominal draw in and neutral spine    Other Quadruped Lumbar Exercises  prayer stretch 2x30 sec and 30 sec to rT and LT     Moist Heat Therapy   Number Minutes Moist Heat 15 Minutes   Moist Heat Location Lumbar Spine                  PT Short Term Goals - 04/28/17 1145      PT SHORT TERM GOAL #1   Title Pt will be able to demonstrate HEP/postural correction following the pictures on the handout.   Time 2   Period Weeks   Status New     PT SHORT TERM GOAL #2   Title He will report pain 30% or more improved.    Time 3   Period Weeks   Status New     PT SHORT TERM GOAL #3   Title He will improve trunk flexion to 50 degrees to demo improved ROM   Time 3   Period Weeks   Status New           PT Long Term Goals - 04/28/17 1202      PT LONG TERM GOAL #1   Title He will be independent with all HEP issued  Time 6   Period Weeks   Status New     PT LONG TERM GOAL #2   Title He will have no pain with ROM of LE   Time 6   Period Weeks   Status New     PT LONG TERM GOAL #3   Title He will be able to lift 20-25 pounds with 1-2 max pain.    Time 6   Period Weeks   Status New     PT LONG TERM GOAL #4   Title He will improve trunk side bending to 25 degrees to decr pain with movement   Time 6   Period Weeks   Status New     PT LONG TERM GOAL #5   Title He will return to previous home tasks with 1-2 max pain   Time 6   Period Weeks   Status New     Additional Long Term Goals   Additional Long Term Goals Yes     PT LONG TERM GOAL #6   Title He will be able to lye on stomach for 30 min before incr pain    Time 6   Period Weeks   Status New               Plan - 05/19/17 1419    Clinical Impression Statement Pain a little better after last session. Pain only with prolonged standing or heavy work. He reported no increased pain with anything exercises this visit. Progressed core strength with good tolerance. Progressing toward STGs.    PT Next Visit Plan review  HEP stretching ,  Manual stretching and STW.  core  strength   PT Home Exercise Plan stretching Knee to chest, post pelvic tilt, extension spine, bridge,     Consulted and Agree with Plan of Care Patient  via interpreter      Patient will benefit from skilled therapeutic intervention in order to improve the following deficits and impairments:  Pain, Postural dysfunction, Decreased range of motion, Decreased activity tolerance, Increased muscle spasms  Visit Diagnosis: Chronic bilateral low back pain without sciatica  Muscle spasm of back  Other symptoms and signs involving the musculoskeletal system  Muscle weakness (generalized)     Problem List Patient Active Problem List   Diagnosis Date Noted  . Tinea versicolor 09/28/2016  . Thoracic compression fracture (HCC) 07/17/2016  . Eye pain 07/17/2016  . Poor appetite 04/30/2016  . Globus sensation 04/18/2016  . Anemia 02/19/2016  . Vitamin D deficiency 02/19/2016  . Urinary frequency 01/23/2016  . Anxiety state 01/23/2016  . Refugee health examination 01/23/2016  . Tobacco use disorder 01/23/2016    Sherrie Mustache, PTA 05/19/2017, 2:50 PM  [facility] [address] [place], [place], [address] Phone: [phone]   Fax:  [phone]  Name: Todd Miller MRN: [id number] Date of Birth: 04-29-1986

## 2017-05-21 ENCOUNTER — Ambulatory Visit: Payer: Medicaid Other

## 2017-05-21 DIAGNOSIS — M545 Low back pain: Secondary | ICD-10-CM | POA: Diagnosis not present

## 2017-05-21 DIAGNOSIS — R29898 Other symptoms and signs involving the musculoskeletal system: Secondary | ICD-10-CM

## 2017-05-21 DIAGNOSIS — M62830 Muscle spasm of back: Secondary | ICD-10-CM

## 2017-05-21 DIAGNOSIS — M6281 Muscle weakness (generalized): Secondary | ICD-10-CM

## 2017-05-21 DIAGNOSIS — G8929 Other chronic pain: Secondary | ICD-10-CM

## 2017-05-21 NOTE — Therapy (Signed)
[facility] [address] [place], [place], [address] Phone: [phone]   Fax:  [phone]  Physical Therapy Treatment  Patient Details  Name: Todd Miller MRN: [id number] Date of Birth: 1986-07-23 Referring Provider: Payton MccallumJeffrey Walden ,MD  Encounter Date: 05/21/2017      PT End of Session - 05/21/17 1144    Visit Number 4   Number of Visits 12   Date for PT Re-Evaluation 06/05/17   Authorization Type self pay   PT Start Time 1145   PT Stop Time 1235   PT Time Calculation (min) 50 min   Activity Tolerance Patient tolerated treatment well;Patient limited by pain   Behavior During Therapy [facility] for tasks assessed/performed      No past medical history on file.  Past Surgical History:  Procedure Laterality Date  . HAND SURGERY      There were no vitals filed for this visit.      Subjective Assessment - 05/21/17 1149    Subjective He reports doing better .    Currently in Pain? Yes   Pain Score 5    Pain Location Back   Pain Orientation Lower   Pain Descriptors / Indicators --  light   Pain Type Chronic pain   Pain Onset More than a month ago   Pain Frequency Constant   Multiple Pain Sites No                         [facility] Adult PT Treatment/Exercise - 05/21/17 0001      Lumbar Exercises: Stretches   Active Hamstring Stretch 1 rep   Active Hamstring Stretch Limitations strap 45 sec RT/LT   Double Knee to Chest Stretch 2 reps;30 seconds   Prone on Elbows Stretch 60 seconds;1 rep   Piriformis Stretch 1 rep;30 seconds  RT/LT     Lumbar Exercises: Supine   Bent Knee Raise Limitations Table top alternating foot taps x 12 reps each foot    Bridge Limitations 1 set wtih ball squeeze 12 reps   Other Supine Lumbar Exercises part sit ups x 12 reach toward ankles     Lumbar Exercises: Sidelying   Clam 10 reps   Clam Limitations both feet lifted RT and LT x 12    Hip Abduction Limitations 12  reps with cues for alighnment     Lumbar Exercises: Quadruped   Opposite Arm/Leg Raise 10 reps;Right arm/Left leg;Left arm/Right leg;2 seconds   Other Quadruped Lumbar Exercises prayer stretch     Moist Heat Therapy   Number Minutes Moist Heat 12 Minutes   Moist Heat Location Lumbar Spine     Manual Therapy   Joint Mobilization PA glides L5 to T10 central gr 2-3 and STW to paraspinals  then bilateral knee to chest 60 sec  60 sec                   PT Short Term Goals - 05/21/17 1228      PT SHORT TERM GOAL #1   Title Pt will be able to demonstrate HEP/postural correction following the pictures on the handout.   Status On-going     PT SHORT TERM GOAL #2   Title He will report pain 30% or more improved.    Status On-going     PT SHORT TERM GOAL #3   Title He will improve trunk flexion to 50 degrees to demo improved ROM  PT Long Term Goals - 04/28/17 1202      PT LONG TERM GOAL #1   Title He will be independent with all HEP issued   Time 6   Period Weeks   Status New     PT LONG TERM GOAL #2   Title He will have no pain with ROM of LE   Time 6   Period Weeks   Status New     PT LONG TERM GOAL #3   Title He will be able to lift 20-25 pounds with 1-2 max pain.    Time 6   Period Weeks   Status New     PT LONG TERM GOAL #4   Title He will improve trunk side bending to 25 degrees to decr pain with movement   Time 6   Period Weeks   Status New     PT LONG TERM GOAL #5   Title He will return to previous home tasks with 1-2 max pain   Time 6   Period Weeks   Status New     Additional Long Term Goals   Additional Long Term Goals Yes     PT LONG TERM GOAL #6   Title He will be able to lye on stomach for 30 min before incr pain    Time 6   Period Weeks   Status New               Plan - 05/21/17 1144    Clinical Impression Statement He reports improvement and pain level at 5/10. He is able to sit in l;obby slump in no distress  and do all exercise without expression change   PT Treatment/Interventions Electrical Stimulation;Iontophoresis 4mg /ml Dexamethasone;Moist Heat;Therapeutic exercise;Patient/family education;Passive range of motion;Manual techniques;Therapeutic activities   PT Home Exercise Plan stretching Knee to chest, post pelvic tilt, extension spine, bridge,     Consulted and Agree with Plan of Care Patient      Patient will benefit from skilled therapeutic intervention in order to improve the following deficits and impairments:  Pain, Postural dysfunction, Decreased range of motion, Decreased activity tolerance, Increased muscle spasms  Visit Diagnosis: Chronic bilateral low back pain without sciatica  Muscle spasm of back  Other symptoms and signs involving the musculoskeletal system  Muscle weakness (generalized)     Problem List Patient Active Problem List   Diagnosis Date Noted  . Tinea versicolor 09/28/2016  . Thoracic compression fracture (HCC) 07/17/2016  . Eye pain 07/17/2016  . Poor appetite 04/30/2016  . Globus sensation 04/18/2016  . Anemia 02/19/2016  . Vitamin D deficiency 02/19/2016  . Urinary frequency 01/23/2016  . Anxiety state 01/23/2016  . Refugee health examination 01/23/2016  . Tobacco use disorder 01/23/2016    Caprice Red   PT 05/21/2017, 12:34 PM  [facility] [address] [place], [place], [address] Phone: [phone]   Fax:  [phone]  Name: Todd Miller MRN: [id number] Date of Birth: 05-23-1986

## 2017-05-26 ENCOUNTER — Telehealth: Payer: Self-pay | Admitting: Physical Therapy

## 2017-05-26 ENCOUNTER — Ambulatory Visit: Payer: Medicaid Other | Attending: Family Medicine | Admitting: Physical Therapy

## 2017-05-26 DIAGNOSIS — M545 Low back pain: Secondary | ICD-10-CM | POA: Insufficient documentation

## 2017-05-26 DIAGNOSIS — M62830 Muscle spasm of back: Secondary | ICD-10-CM | POA: Insufficient documentation

## 2017-05-26 DIAGNOSIS — R29898 Other symptoms and signs involving the musculoskeletal system: Secondary | ICD-10-CM | POA: Insufficient documentation

## 2017-05-26 DIAGNOSIS — G8929 Other chronic pain: Secondary | ICD-10-CM | POA: Insufficient documentation

## 2017-05-26 DIAGNOSIS — M6281 Muscle weakness (generalized): Secondary | ICD-10-CM | POA: Insufficient documentation

## 2017-05-26 NOTE — Telephone Encounter (Signed)
Called Patient via interpreter due to missing his appointment this morning. Left next visit appointment time.

## 2017-05-28 ENCOUNTER — Ambulatory Visit: Payer: Medicaid Other

## 2017-05-28 DIAGNOSIS — G8929 Other chronic pain: Secondary | ICD-10-CM | POA: Diagnosis present

## 2017-05-28 DIAGNOSIS — M62830 Muscle spasm of back: Secondary | ICD-10-CM | POA: Diagnosis present

## 2017-05-28 DIAGNOSIS — M545 Low back pain, unspecified: Secondary | ICD-10-CM

## 2017-05-28 DIAGNOSIS — M6281 Muscle weakness (generalized): Secondary | ICD-10-CM

## 2017-05-28 DIAGNOSIS — R29898 Other symptoms and signs involving the musculoskeletal system: Secondary | ICD-10-CM | POA: Diagnosis present
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
## 2017-06-01 ENCOUNTER — Ambulatory Visit: Payer: Medicaid Other

## 2017-06-01 DIAGNOSIS — G8929 Other chronic pain: Secondary | ICD-10-CM

## 2017-06-01 DIAGNOSIS — M6281 Muscle weakness (generalized): Secondary | ICD-10-CM

## 2017-06-01 DIAGNOSIS — M545 Low back pain, unspecified: Secondary | ICD-10-CM

## 2017-06-01 DIAGNOSIS — M62830 Muscle spasm of back: Secondary | ICD-10-CM

## 2017-06-01 DIAGNOSIS — R29898 Other symptoms and signs involving the musculoskeletal system: Secondary | ICD-10-CM

## 2017-06-01 NOTE — Therapy (Signed)
[facility] [address] [place], [place], [address] Phone: [phone]   Fax:  [phone]  Physical Therapy Treatment  Patient Details  Name: Todd Miller MRN: [id number] Date of Birth: [birth year] Referring Provider: Payton Mccallum ,MD  Encounter Date: 06/01/2017      PT End of Session - 06/01/17 1426    Visit Number 6   Number of Visits 12   Date for PT Re-Evaluation 06/05/17   Authorization Type self pay   PT Start Time 0215   PT Stop Time 0305   PT Time Calculation (min) 50 min   Activity Tolerance Patient tolerated treatment well;Patient limited by pain   Behavior During Therapy [facility] for tasks assessed/performed      No past medical history on file.  Past Surgical History:  Procedure Laterality Date  . HAND SURGERY      There were no vitals filed for this visit.      Subjective Assessment - 06/01/17 1425    Subjective 5/10 today   Patient is accompained by: Interpreter   Currently in Pain? Yes   Pain Score 5    Pain Location Back   Pain Orientation Lower   Pain Descriptors / Indicators --  light   Pain Type Chronic pain   Pain Onset More than a month ago   Pain Frequency Constant   Aggravating Factors  driving   Pain Relieving Factors rest lying   Multiple Pain Sites No                         [facility] Adult PT Treatment/Exercise - 06/01/17 0001      Lumbar Exercises: Aerobic   Stationary Bike L1 5 min     Lumbar Exercises: Supine   Bent Knee Raise Limitations bilateral table top   20 reps  bicycling   Bridge Limitations 20 reps with ball squeeze then 20 reps with green band clam, No appearance of pain /discomfort   Other Supine Lumbar Exercises part sit ups x 25 reach toward ankles   Other Supine Lumbar Exercises table top bilateral with leg abduct x 10  then x 10 RT/LT both legs hip twist x20 .      Lumbar Exercises: Sidelying   Clam 15 reps   Clam Limitations both  feet lifted RT and LT    Hip Abduction Limitations 15  reps RT/LT     Lumbar Exercises: Quadruped   Plank on elbows x10 5 sec hold then side planks x  5 RT/ LT      Moist Heat Therapy   Number Minutes Moist Heat 12 Minutes   Moist Heat Location Lumbar Spine                  PT Short Term Goals - 05/28/17 1234      PT SHORT TERM GOAL #2   Title He will report pain 30% or more improved.    Status On-going     PT SHORT TERM GOAL #3   Title He will improve trunk flexion to 50 degrees to demo improved ROM   Status Achieved           PT Long Term Goals - 05/28/17 1235      PT LONG TERM GOAL #1   Title He will be independent with all HEP issued   Status On-going     PT LONG TERM GOAL #2   Title He will have no pain with  ROM of LE   Status On-going     PT LONG TERM GOAL #3   Title He will be able to lift 20-25 pounds with 1-2 max pain.    Status On-going     PT LONG TERM GOAL #4   Title He will improve trunk side bending to 25 degrees to decr pain with movement   Status On-going     PT LONG TERM GOAL #5   Title He will return to previous home tasks with 1-2 max pain   Status On-going     PT LONG TERM GOAL #6   Title He will be able to lye on stomach for 30 min before incr pain    Status On-going               Plan - 06/01/17 1426    Clinical Impression Statement On talking to pt he has no limits for activity at work and home and does not lifting /pushing /pulling at home or work  and he is doing 20 min or more exercise at home and is doing pushups.  Pain levels are stagnent.  so will see over next 2 appointments if neede to discharge. He is able to do all challenging exercises in clinic without expression of discomfort/pain   PT Treatment/Interventions Electrical Stimulation;Iontophoresis 4mg /ml Dexamethasone;Moist Heat;Therapeutic exercise;Patient/family education;Passive range of motion;Manual techniques;Therapeutic activities   PT Next Visit Plan  Progress stabilization exercises, manual gentle mobs / STW  ,  trial lifting   PT Home Exercise Plan stretching Knee to chest, post pelvic tilt, extension spine, bridge,        Patient will benefit from skilled therapeutic intervention in order to improve the following deficits and impairments:  Pain, Postural dysfunction, Decreased range of motion, Decreased activity tolerance, Increased muscle spasms  Visit Diagnosis: Chronic bilateral low back pain without sciatica  Muscle spasm of back  Other symptoms and signs involving the musculoskeletal system  Muscle weakness (generalized)     Problem List Patient Active Problem List   Diagnosis Date Noted  . Tinea versicolor 09/28/2016  . Thoracic compression fracture (HCC) 07/17/2016  . Eye pain 07/17/2016  . Poor appetite 04/30/2016  . Globus sensation 04/18/2016  . Anemia 02/19/2016  . Vitamin D deficiency 02/19/2016  . Urinary frequency 01/23/2016  . Anxiety state 01/23/2016  . Refugee health examination 01/23/2016  . Tobacco use disorder 01/23/2016    Caprice RedChasse, Evalise Abruzzese M  PT 06/01/2017, 2:57 PM  [facility] [address] [place], [place], [address] Phone: [phone]   Fax:  [phone]  Name: Todd Miller MRN: [id number] Date of Birth: [birth year]

## 2017-06-08 ENCOUNTER — Ambulatory Visit: Payer: Medicaid Other

## 2017-06-08 DIAGNOSIS — M62830 Muscle spasm of back: Secondary | ICD-10-CM

## 2017-06-08 DIAGNOSIS — M545 Low back pain: Principal | ICD-10-CM

## 2017-06-08 DIAGNOSIS — G8929 Other chronic pain: Secondary | ICD-10-CM

## 2017-06-08 DIAGNOSIS — M6281 Muscle weakness (generalized): Secondary | ICD-10-CM

## 2017-06-08 DIAGNOSIS — R29898 Other symptoms and signs involving the musculoskeletal system: Secondary | ICD-10-CM

## 2017-06-08 NOTE — Therapy (Signed)
[facility] [address] [place], [place], [address] Phone: [phone]   Fax:  [phone]  Physical Therapy Treatment  Patient Details  Name: Todd Miller MRN: [id number] Date of Birth: 1986/06/22 Referring Provider: Payton Mccallum ,MD  Encounter Date: 06/08/2017      PT End of Session - 06/08/17 1412    Visit Number 7   Number of Visits 12   Date for PT Re-Evaluation 06/05/17   Authorization Type self pay   PT Start Time 0215   PT Stop Time 0305   PT Time Calculation (min) 50 min   Activity Tolerance Patient tolerated treatment well;No increased pain   Behavior During Therapy [facility] for tasks assessed/performed      History reviewed. No pertinent past medical history.  Past Surgical History:  Procedure Laterality Date  . HAND SURGERY      There were no vitals filed for this visit.      Subjective Assessment - 06/08/17 1413    Subjective Light pain today 2/10 pain    Patient is accompained by: Interpreter   Currently in Pain? Yes   Pain Score 2    Pain Location Back   Pain Orientation Lower   Pain Descriptors / Indicators --  light   Pain Type Chronic pain   Pain Onset More than a month ago   Pain Frequency Constant   Aggravating Factors  prolonged sitting driving   Pain Relieving Factors rest, lye   Multiple Pain Sites No                         [facility] Adult PT Treatment/Exercise - 06/08/17 0001      Lumbar Exercises: Stretches   Double Knee to Chest Stretch 1 rep;30 seconds     Lumbar Exercises: Supine   Bridge Limitations x15 alternating bridge and bilaterla knee to chest20 reps with ball squeeze then 20 reps with green band clam, No appearance of pain /discomfort   Other Supine Lumbar Exercises part sit ups x 25 reach toward ankles   Other Supine Lumbar Exercises dead bug x 15 RT and LT   table top bilateral with leg abduct x 10  then x 10 RT/LT both legs hip twist x20 .       Lumbar Exercises: Sidelying   Clam Limitations both feet lifted RT and LT x12 with red band at knees.      Lumbar Exercises: Quadruped   Opposite Arm/Leg Raise Right arm/Left leg;Left arm/Right leg;3 seconds;10 reps   Opposite Arm/Leg Raise Limitations green band pulls   Plank on elbows x10 5 sec hold      Moist Heat Therapy   Number Minutes Moist Heat 12 Minutes   Moist Heat Location Lumbar Spine     Manual Therapy   Joint Mobilization PA glides L5 to T10 central gr 2-3 and STW to paraspinals  then sustained glide with deep breath x2 .then shifted up a level and did x2 with deep breath.   then prayer stretch x 60 sec                   PT Short Term Goals - 06/08/17 1420      PT SHORT TERM GOAL #1   Title Pt will be able to demonstrate HEP/postural correction following the pictures on the handout.   Status Achieved     PT SHORT TERM GOAL #2   Title He will report pain 30% or  more improved.    Status Achieved     PT SHORT TERM GOAL #3   Title He will improve trunk flexion to 50 degrees to demo improved ROM   Status Achieved           PT Long Term Goals - 06/08/17 1420      PT LONG TERM GOAL #1   Title He will be independent with all HEP issued   Status On-going     PT LONG TERM GOAL #2   Title He will have no pain with ROM of LE   Status On-going     PT LONG TERM GOAL #3   Title He will be able to lift 20-25 pounds with 1-2 max pain.    Status On-going     PT LONG TERM GOAL #4   Title He will improve trunk side bending to 25 degrees to decr pain with movement   Status On-going               Plan - 06/08/17 1411    Clinical Impression Statement Slightly more sore after exercises but resolved with heat pack .He isdoing fairly vigorous exercise now . Pain level on coming in today decreased.    PT Treatment/Interventions Electrical Stimulation;Iontophoresis 4mg /ml Dexamethasone;Moist Heat;Therapeutic exercise;Patient/family education;Passive  range of motion;Manual techniques;Therapeutic activities   PT Next Visit Plan Progress stabilization exercises, manual gentle mobs / STW   trial of lifting   PT Home Exercise Plan stretching Knee to chest, post pelvic tilt, extension spine, bridge,     Consulted and Agree with Plan of Care Patient      Patient will benefit from skilled therapeutic intervention in order to improve the following deficits and impairments:  Pain, Postural dysfunction, Decreased range of motion, Decreased activity tolerance, Increased muscle spasms  Visit Diagnosis: Chronic bilateral low back pain without sciatica  Muscle spasm of back  Other symptoms and signs involving the musculoskeletal system  Muscle weakness (generalized)     Problem List Patient Active Problem List   Diagnosis Date Noted  . Tinea versicolor 09/28/2016  . Thoracic compression fracture (HCC) 07/17/2016  . Eye pain 07/17/2016  . Poor appetite 04/30/2016  . Globus sensation 04/18/2016  . Anemia 02/19/2016  . Vitamin D deficiency 02/19/2016  . Urinary frequency 01/23/2016  . Anxiety state 01/23/2016  . Refugee health examination 01/23/2016  . Tobacco use disorder 01/23/2016    Todd Miller, Todd Miller  PT 06/08/2017, 2:48 PM  [facility] [address] [place], [place], [address] Phone: [phone]   Fax:  [phone]  Name: Todd Miller MRN: [id number] Date of Birth: September 19, 1986

## 2017-06-11 ENCOUNTER — Ambulatory Visit: Payer: Medicaid Other

## 2017-06-11 DIAGNOSIS — M545 Low back pain, unspecified: Secondary | ICD-10-CM

## 2017-06-11 DIAGNOSIS — M6281 Muscle weakness (generalized): Secondary | ICD-10-CM

## 2017-06-11 DIAGNOSIS — G8929 Other chronic pain: Secondary | ICD-10-CM

## 2017-06-11 DIAGNOSIS — M62830 Muscle spasm of back: Secondary | ICD-10-CM

## 2017-06-11 DIAGNOSIS — R29898 Other symptoms and signs involving the musculoskeletal system: Secondary | ICD-10-CM

## 2017-06-11 NOTE — Patient Instructions (Signed)
Issued quadra ped , multifidus , scissors and bridging ( with squeeze and clam green band) exercises  And clams with feet lifted sidelye   Daily x10-20 reps . Also with band for quadraped arm /leg lift

## 2017-06-11 NOTE — Therapy (Signed)
[place], [place], [address] Phone: [phone]   Fax:  [phone]  Physical Therapy Treatment/Discharge  Patient Details  Name: Todd Miller MRN: [id number] Date of Birth: 1986/01/20 Referring Provider: Esmond Camper ,MD  Encounter Date: 06/11/2017      PT End of Session - 06/11/17 1551    Visit Number 8   Number of Visits 12   Date for PT Re-Evaluation 06/15/17   Authorization Type self pay   PT Start Time 0350   PT Stop Time 0440   PT Time Calculation (min) 50 min   Activity Tolerance Patient tolerated treatment well;No increased pain   Behavior During Therapy [facility] for tasks assessed/performed      No past medical history on file.  Past Surgical History:  Procedure Laterality Date  . HAND SURGERY      There were no vitals filed for this visit.      Subjective Assessment - 06/11/17 1630    Subjective Doing well pain 2/10    Currently in Pain? Yes   Pain Score 2    Pain Location Back   Pain Orientation Lower            [facility] PT Assessment - 06/11/17 0001      AROM   Lumbar Flexion 55   Lumbar Extension 20   Lumbar - Right Side Bend 20   Lumbar - Left Side Bend 25     Treatment HEP quadraped , scissors, clams, multifidus all 15 -20 reps . Bridge with ball squeeze and clam and band with quadraped arm and leg lift. REviewed lifting technique and he was not able to perform this correctly  Even with cues so cautioned him to work on this and limit heavy lifting and to use lifting straps if able.  Cued to not flex spine. He had incr pain after lifting 50 pounds with 70 being max tolerated as he said the items at home he lifted intermittanly 1-2x/week was this heaviness                        PT Education - 06/11/17 1632    Education provided Yes   Education Details HEP, Lifting limits and technique   Person(s) Educated Patient   Methods  Explanation;Demonstration;Verbal cues;Handout   Comprehension Returned demonstration;Verbalized understanding          PT Short Term Goals - 06/08/17 1420      PT SHORT TERM GOAL #1   Title Pt will be able to demonstrate HEP/postural correction following the pictures on the handout.   Status Achieved     PT SHORT TERM GOAL #2   Title He will report pain 30% or more improved.    Status Achieved     PT SHORT TERM GOAL #3   Title He will improve trunk flexion to 50 degrees to demo improved ROM   Status Achieved           PT Long Term Goals - 06/11/17 1640      PT LONG TERM GOAL #1   Title He will be independent with all HEP issued   Status Achieved     PT LONG TERM GOAL #2   Title He will have no pain with ROM of LE   Status Achieved     PT LONG TERM GOAL #3   Title He will be able to lift 20-25 pounds with 1-2 max pain.  Baseline able to lift 50 pounds with 2-3 max 40 pounds without increased pain   Status Achieved     PT LONG TERM GOAL #4   Title He will improve trunk side bending to 25 degrees to decr pain with movement   Status Partially Met     PT LONG TERM GOAL #5   Title He will return to previous home tasks with 1-2 max pain   Baseline all but lifting and driving loing distances   Status Partially Met               Plan - 06/11/17 1554    Clinical Impression Statement He agreed tpo discharge. I felt that functionally he is doing well and able to do all normal activity though heavier weight (he was able to lift 70 pounds) and prolonged positions do still increase pain . He is abl;e to perform all hEP correctly. When asked he felt he was ready for discharge. I expect with the fracture it may take awhile with the exercise to resolve pain issues as his tolerance has improved significantly and pain levels reduced . He wa advised to not lift realyy heavy weight > 50 pounds  but to practice lifting with progressive weight to tolerance. .  Also suggested use  of lifting straps to do heavylifting of furniture that he does 1-2x/week at home.  I think we have maxed benefit for PT at this time   PT Treatment/Interventions Electrical Stimulation;Iontophoresis 49m/ml Dexamethasone;Moist Heat;Therapeutic exercise;Patient/family education;Passive range of motion;Manual techniques;Therapeutic activities   PT Next Visit Plan Discharge with HEP.    PT Home Exercise Plan stretching Knee to chest, post pelvic tilt, extension spine, bridge,  li=fting technique and core stab exercises.    Consulted and Agree with Plan of Care Patient      Patient will benefit from skilled therapeutic intervention in order to improve the following deficits and impairments:  Pain, Postural dysfunction, Decreased range of motion, Decreased activity tolerance, Increased muscle spasms  Visit Diagnosis: Chronic bilateral low back pain without sciatica  Muscle spasm of back  Other symptoms and signs involving the musculoskeletal system  Muscle weakness (generalized)     Problem List Patient Active Problem List   Diagnosis Date Noted  . Tinea versicolor 09/28/2016  . Thoracic compression fracture ([place] 07/17/2016  . Eye pain 07/17/2016  . Poor appetite 04/30/2016  . Globus sensation 04/18/2016  . Anemia 02/19/2016  . Vitamin D deficiency 02/19/2016  . Urinary frequency 01/23/2016  . Anxiety state 01/23/2016  . Refugee health examination 01/23/2016  . Tobacco use disorder 01/23/2016    CDarrel HooverPT 06/11/2017, 4:43 PM  [facility] [place] [address]Phone: [phone]  Fax:  [phone] Name: Todd GoyerMRN: [id number] of Birth: 3October 01, 1987 PHYSICAL THERAPY DISCHARGE SUMMARY  Visits from Start of Care: 8  Current functional level related to goals / functional outcomes: See above   Remaining deficits: Pain in low back, pain with prolonged positions , pain with  heavy lifting   Education / Equipment: Core stabilization exer, stretching Plan: Patient agrees to discharge.  Patient goals were partially met. Patient is being discharged due to                                                     ?????  Max benefit form PT at this time

## 2017-06-15 ENCOUNTER — Ambulatory Visit: Payer: Medicaid Other

## 2017-07-17 ENCOUNTER — Encounter (HOSPITAL_COMMUNITY): Payer: Self-pay | Admitting: Emergency Medicine

## 2017-07-17 ENCOUNTER — Emergency Department (HOSPITAL_COMMUNITY): Payer: Medicaid Other

## 2017-07-17 ENCOUNTER — Emergency Department (HOSPITAL_COMMUNITY)
Admission: EM | Admit: 2017-07-17 | Discharge: 2017-07-17 | Disposition: A | Discharge status: Home / Self Care | Payer: Medicaid Other | Attending: Emergency Medicine | Admitting: Emergency Medicine

## 2017-07-17 DIAGNOSIS — M546 Pain in thoracic spine: Secondary | ICD-10-CM

## 2017-07-17 DIAGNOSIS — M545 Low back pain, unspecified: Secondary | ICD-10-CM

## 2017-07-17 DIAGNOSIS — F17290 Nicotine dependence, other tobacco product, uncomplicated: Secondary | ICD-10-CM | POA: Insufficient documentation

## 2017-07-17 MED ORDER — TRAMADOL HCL 50 MG PO TABS: 50.0000 mg | ORAL_TABLET | Freq: Four times a day (QID) | ORAL | 0 refills | Status: DC | PRN

## 2017-07-17 MED ORDER — FENTANYL CITRATE (PF) 100 MCG/2ML IJ SOLN
25.0000 ug | Freq: Once | INTRAMUSCULAR | Status: AC
  Administered 2017-07-17: 25 ug via INTRAMUSCULAR

## 2017-07-17 MED ORDER — CYCLOBENZAPRINE HCL 10 MG PO TABS
10.0000 mg | ORAL_TABLET | Freq: Once | ORAL | Status: AC
  Administered 2017-07-17: 10 mg via ORAL
  Filled 2017-07-17: qty 1

## 2017-07-17 MED ORDER — KETOROLAC TROMETHAMINE 60 MG/2ML IM SOLN
30.0000 mg | Freq: Once | INTRAMUSCULAR | Status: AC
  Administered 2017-07-17: 30 mg via INTRAMUSCULAR
  Filled 2017-07-17: qty 2

## 2017-07-17 MED ORDER — CYCLOBENZAPRINE HCL 10 MG PO TABS: 10.0000 mg | ORAL_TABLET | Freq: Three times a day (TID) | ORAL | 0 refills | Status: DC | PRN

## 2017-07-17 MED ORDER — FENTANYL CITRATE (PF) 100 MCG/2ML IJ SOLN
25.0000 ug | Freq: Once | INTRAMUSCULAR | Status: DC
  Filled 2017-07-17: qty 2

## 2017-07-17 NOTE — ED Triage Notes (Signed)
Patient here from home with complaints of fall down 3 stairs. Reports lower back pain 10/10. Hx of back surgery 1 year ago. No english.

## 2017-07-17 NOTE — ED Provider Notes (Signed)
[facility]-EMERGENCY DEPT Provider Note   CSN: [id number] Arrival date & time: 07/17/17  1701     History   Chief Complaint Chief Complaint  Patient presents with  . Back Pain    HPI Todd Miller is a 31 y.o. male with history of thoracic compression fracture, tobacco use disorder, anemia, anxiety, and vitamin D deficiency Who presents a with chief complaint acute onset, constant thoracic and lumbar spine pain which occurred 30 minutes prior to arrival. Patient was going down stairs when he slipped and fell down landing on his back. He denies head injury or loss of consciousness. Since then, he endorses constant severe sharp midline lower thoracic and lumbar spine pain without radiation to the extremities. He denies numbness, tingling, or weakness but states it is very difficult for him to ambulate. He has a history of back pain in this exact area [facility] spine [facility] 1 year [facility]. He has been in physical therapy for the past several weeks which she states has been very helpful, but his pain returned after the fall. Denies bowel or bladder incontinence, IV drug use, history of cancer. Patient's wife states that she feels he feels subjectively warm. Denies difficult breathing, chest pain, abdominal pain, nausea, vomiting, or altered mental status. Has not tried anything for his symptoms. Pain worsens on palpation and any sort of movement. The history is provided by the patient.    History reviewed. No pertinent past medical history.  Patient Active Problem List   Diagnosis Date Noted  . Tinea versicolor 09/28/2016  . Thoracic compression fracture (HCC) 07/17/2016  . Eye pain 07/17/2016  . Poor appetite 04/30/2016  . Globus sensation 04/18/2016  . Anemia 02/19/2016  . Vitamin D deficiency 02/19/2016  . Urinary frequency 01/23/2016  . Anxiety state 01/23/2016  . Refugee health examination 01/23/2016  . Tobacco use disorder 01/23/2016    Past  Surgical History:  Procedure Laterality Date  . HAND SURGERY         Home Medications    Prior to Admission medications   Medication Sig Start Date End Date Taking? Authorizing Provider  acetaminophen (TYLENOL) 500 MG tablet Take 500 mg by mouth every 6 (six) hours as needed for moderate pain.   Yes [provider]  baclofen (LIORESAL) 10 MG tablet Take 1 tablet (10 mg total) by mouth at bedtime as needed for muscle spasms. 03/03/17   Tobey GrimWalden, Jeffrey H, MD  cyclobenzaprine (FLEXERIL) 10 MG tablet Take 1 tablet (10 mg total) by mouth 3 (three) times daily as needed for muscle spasms. 07/17/17   Luevenia MaxinFawze, Tery Hoeger A, PA-C  traMADol (ULTRAM) 50 MG tablet Take 1 tablet (50 mg total) by mouth every 6 (six) hours as needed for severe pain. 07/17/17   Michela PitcherFawze, Armenta Erskin A, PA-C  traMADol-acetaminophen (ULTRACET) 37.5-325 MG tablet Take 1 tablet by mouth every 8 (eight) hours as needed. Patient taking differently: Take 1 tablet by mouth every 8 (eight) hours as needed for severe pain.  04/14/17   Tobey GrimWalden, Jeffrey H, MD    Family History No family history on file.  Social History Social History  Substance Use Topics  . Smoking status: Current Some Day Smoker    Types: Pipe  . Smokeless tobacco: Never Used  . Alcohol use No     Allergies   Patient has no known allergies.   Review of Systems Review of Systems  Constitutional: Negative for fever.  Eyes: Negative for visual disturbance.  Respiratory:  Negative for shortness of breath.   Cardiovascular: Negative for chest pain.  Gastrointestinal: Negative for abdominal pain, diarrhea, nausea and vomiting.  Genitourinary:       No bowel/bladder incontinence  Musculoskeletal: Positive for back pain. Negative for neck pain.  Neurological: Negative for weakness, numbness and headaches.  All other systems reviewed and are negative.    Physical Exam Updated Vital Signs BP 124/77 (BP Location: Right Arm)   Pulse 65   Temp 97.7 F (36.5 C)  (Oral)   Resp 20   SpO2 99%   Physical Exam  Constitutional: He is oriented to person, place, and time. He appears well-developed and well-nourished. No distress.  HENT:  Head: Normocephalic and atraumatic.  Right Ear: External ear normal.  Left Ear: External ear normal.  No Battle's signs, no raccoon's eyes, no rhinorrhea. No hemotympanum. No tenderness to palpation of the face or skull. No deformity, crepitus, or swelling noted.   Eyes: Pupils are equal, round, and reactive to light. Conjunctivae and EOM are normal. Right eye exhibits no discharge. Left eye exhibits no discharge.  Neck: Normal range of motion. Neck supple. No JVD present. No tracheal deviation present.  No midline spine TTP, no paraspinal muscle tenderness, no deformity, crepitus, or step-off noted   Cardiovascular: Normal rate, regular rhythm, normal heart sounds and intact distal pulses.   2+ radial and DP/PT pulses bl, negative Homan's bl   Pulmonary/Chest: Effort normal and breath sounds normal. He exhibits no tenderness.  Abdominal: Soft. Bowel sounds are normal. He exhibits no distension. There is no tenderness.  Musculoskeletal: He exhibits tenderness. He exhibits no edema.  Midline spine tenderness from around T10-L5. No deformity, crepitus, or step-off noted. No paraspinal muscle tenderness. Limited range of motion due to pain. Negative straight leg raise bilaterally. Normal range of motion of extremities and 5/5 strength of DOE and BLE major muscle groups with pain elicited in the back on motion of the lower extremities.  Neurological: He is alert and oriented to person, place, and time. No cranial nerve deficit or sensory deficit.  Fluent speech, no facial droop, sensation intact to soft touch of extremities. Cranial nerves III through XII tested and intact. Unable to assess gait due to pain.  Skin: Skin is warm and dry. Capillary refill takes less than 2 seconds. No erythema.  Psychiatric: He has a normal mood  and affect. His behavior is normal.  Nursing note and vitals reviewed.    ED Treatments / Results  Labs (all labs ordered are listed, but only abnormal results are displayed) Labs Reviewed - No data to display  EKG  EKG Interpretation None       Radiology Ct Thoracic Spine Wo Contrast  Result Date: 07/17/2017 CLINICAL DATA:  Patient fell down 3 stairs. EXAM: CT THORACIC SPINE WITHOUT CONTRAST TECHNIQUE: Multidetector CT images of the thoracic were obtained using the standard protocol without intravenous contrast. COMPARISON:  CT lumbar spine reported separately. MRI thoracic spine 01/20/2017. FINDINGS: Alignment: Anatomic Vertebrae: Minor endplate deformities at T6 and T12 were reported previously. Schmorl's nodes at T12, appear unchanged from prior MR. No acute fracture. Paraspinal and other soft tissues: Unremarkable. Disc levels: No calcified disc protrusion or spinal stenosis. IMPRESSION: Unremarkable thoracic spine CT. Chronic changes as described. No posttraumatic compression fracture or subluxation is observed. Electronically Signed   By: Elsie StainJohn T Curnes M.D.   On: 07/17/2017 19:28   Ct Lumbar Spine Wo Contrast  Result Date: 07/17/2017 CLINICAL DATA:  Patient fell down 3 stairs. EXAM:  CT LUMBAR SPINE WITHOUT CONTRAST TECHNIQUE: Multidetector CT imaging of the lumbar spine was performed without intravenous contrast administration. Multiplanar CT image reconstructions were also generated. COMPARISON:  CT thoracic spine reported separately. MRI lumbar spine 01/20/2017. FINDINGS: Segmentation: Five lumbar type vertebrae. Alignment: Normal. Vertebrae: No acute fracture or focal pathologic process. Incidental limbus vertebra L5. Scattered non worrisome Schmorl's nodes. Paraspinal and other soft tissues: Unremarkable Disc levels: No visible disc protrusion or spinal stenosis. IMPRESSION: Negative exam. Electronically Signed   By: Elsie Stain M.D.   On: 07/17/2017 19:25     Procedures Procedures (including critical care time)  Medications Ordered in ED Medications  cyclobenzaprine (FLEXERIL) tablet 10 mg (10 mg Oral Given 07/17/17 1836)  ketorolac (TORADOL) injection 30 mg (30 mg Intramuscular Given 07/17/17 1836)  fentaNYL (SUBLIMAZE) injection 25 mcg (25 mcg Intramuscular Given 07/17/17 1836)     Initial Impression / Assessment and Plan / ED Course  I have reviewed the triage vital signs and the nursing notes.  Pertinent labs & imaging results that were available during my care of the patient were reviewed by me and considered in my medical decision making (see chart for details).     Patient with complaint of midline thoracic and lumbar spine pain secondary to fall earlier today. Afebrile, vital signs are stable, no focal neurological deficits. Ambulatory with significant difficulty and pain. With known degeneration and midline pain, obtained a CT of the thoracic and lumbar spine. Imaging shows no acute evidence of fracture or dislocation. No evidence of closed head injury, chest injury, or intra-abdominal injury. Pain managed while in the ED. Discussed treatment with pain medication, muscle relaxants, anti-inflammatories, and physical therapy exercises. Advised of proper use of medications and that muscle relaxant and pain medications may make the patient drowsy. Recommend follow-up with primary care within 1 week for reevaluation and further management. Discussed indications for return to the ED. Patient and patient's wife verbalized understanding of and agreement with plan and patient is stable for discharge home at this time.  Final Clinical Impressions(s) / ED Diagnoses   Final diagnoses:  Acute midline low back pain without sciatica  Acute midline thoracic back pain    New Prescriptions New Prescriptions   CYCLOBENZAPRINE (FLEXERIL) 10 MG TABLET    Take 1 tablet (10 mg total) by mouth 3 (three) times daily as needed for muscle spasms.    TRAMADOL (ULTRAM) 50 MG TABLET    Take 1 tablet (50 mg total) by mouth every 6 (six) hours as needed for severe pain.     Bennye Alm 07/17/17 2031    Tegeler, Canary Brim, MD 07/18/17 [phone]

## 2017-07-17 NOTE — ED Notes (Signed)
Patient slid down 3 steps. Patient family states he is unable to stand. Patient able to move feet and wiggle toes. Patient denies in decrease in sensation to extremities.

## 2017-07-17 NOTE — ED Notes (Signed)
Patient family members repeatedly coming in and out of room. Family has been advised they may only have two people at the bedside. Remains noncompliant.

## 2017-07-17 NOTE — ED Notes (Signed)
Patient transported to CT 

## 2017-07-17 NOTE — Discharge Instructions (Signed)
Take 600 mg of ibuprofen every 6 hours for pain. He may take cyclobenzaprine up to 3 times daily for muscle spasms and tramadol up to 4 times daily for pain. Apply ice or heat to the affected area multiple times daily for comfort. Take hot showers or hot baths and do some gentle stretching to avoid muscle stiffness. Take short frequent walks and do the back exercises daily. Follow-up with your primary care physician for reevaluation next week. Return to the ED immediately if any concerning signs or symptoms develop.

## 2017-07-25 NOTE — Progress Notes (Signed)
   [facility] Phone: [phone]   Date of Visit: 07/27/2017   HPI:  ED Follow Up:  - patient was seen in the ED on 7/27 after he slipped and fell when he was walking down stairs. He landed on his back. The stairs were wooden. Reports of bilateral low back pain without any radiation. Denies any lower extremity weakness or numbness. Denies any urinary retention or incontinence. Denies any bowel incontinence  - denies head injury or loss of consciousness  -  in the ED ,Obtained CT of thoracic and lumbar spine which showed no evidence of fracture or dislocation.  - Patient was given 5 days of tramadol and Flexeril for pain - Reports he still has pain and has not improved.  he is unable to sleep because of his pain. reports the same pain that he had even prior to the recent incident. He did do physical therapy which she has now completed. He was given home exercises which help somewhat.    rash: - Reports of rash on his trunk that has been worsening over the past few weeks. Reports that he had this once before and he was given a shampoo and it resolved but shortly after it returned. Rash is not itchy. He has not tried any thing for his symptoms. No exposure to insects or ticks and no recent travel. No new detergents, soaps, lotions. Denies any fevers, chills, oral lesions. No recent antibiotics.  ROS: See HPI.  PMFSH:  Chronic Back Pain  PHYSICAL EXAM: BP 118/76   Pulse 64   Temp 98.1 F (36.7 C) (Oral)   Wt 133 lb (60.3 kg)   SpO2 98%   BMI 21.47 kg/m  GEN: NAD H normal oropharynxEENT:  CV: RRR, no murmurs, rubs, or gallops PULM: CTAB, normal effort SKIN: diffuse hyperpigmented macular rash on trunk (chest and back without any erythema);   warm and well-perfused MSK: Limited range of motion of the lower back due to pain (flexion, extension, lateral flexion, rotation.  tenderness to palpation of midline spine from T12 to the sacrum. Paraspinal muscle  tenderness along the same region. 5 out of 5 strength of the lower extremities. Normal sensation to light touch of bilateral lower extremities. Patellar reflexes normal bilaterally. DP and PT pulses intact bilaterally EXTR: No lower extremity edema or calf tenderness PSYCH: Mood and affect euthymic, normal rate and volume of speech NEURO: Awake, alert, no focal deficits grossly, normal speech  ASSESSMENT/PLAN:  Chronic low back pain CT obtained at ED without any acute findings. Patient still continues to have pain that limits his normal routines and also affects his sleep at night. Discussed that this will take a few weeks to improve. Encouraged patient to do PT exercises given to him. Tramadol acetaminophen refilled 30 tablets. Controlled substance database checked and is appropriate.  Tinea versicolor Provided ketoconazole shampoo to apply daily, weight 35 minutes then rinse off. Try therapy for 2 weeks. Return to clinic if    follow-up in 3 weeks to see how back pain is doing   Palma HolterKanishka G Burley Kopka, MD PGY 3 [facility]

## 2017-07-27 ENCOUNTER — Encounter: Payer: Self-pay | Admitting: Internal Medicine

## 2017-07-27 ENCOUNTER — Ambulatory Visit (INDEPENDENT_AMBULATORY_CARE_PROVIDER_SITE_OTHER): Payer: Medicaid Other | Admitting: Internal Medicine

## 2017-07-27 DIAGNOSIS — G8929 Other chronic pain: Secondary | ICD-10-CM | POA: Diagnosis not present

## 2017-07-27 DIAGNOSIS — B360 Pityriasis versicolor: Secondary | ICD-10-CM

## 2017-07-27 DIAGNOSIS — M545 Low back pain, unspecified: Secondary | ICD-10-CM

## 2017-07-27 MED ORDER — KETOCONAZOLE 2 % EX SHAM
1.0000 | MEDICATED_SHAMPOO | CUTANEOUS | 0 refills | Status: DC
Start: 2017-07-27 — End: 2017-07-27

## 2017-07-27 MED ORDER — TRAMADOL-ACETAMINOPHEN 37.5-325 MG PO TABS: 1.0000 | ORAL_TABLET | Freq: Three times a day (TID) | ORAL | 0 refills | Status: DC | PRN

## 2017-07-27 MED ORDER — KETOCONAZOLE 2 % EX SHAM: 1.0000 "application " | MEDICATED_SHAMPOO | CUTANEOUS | 0 refills | Status: DC

## 2017-07-27 NOTE — Patient Instructions (Signed)
Use the shampoo for your rash. Use once a day leave it on for about 3- 5 minutes then rinse off  Follow up in 3 weeks.

## 2017-07-28 NOTE — Assessment & Plan Note (Signed)
CT obtained at ED without any acute findings. Patient still continues to have pain that limits his normal routines and also affects his sleep at night. Discussed that this will take a few weeks to improve. Encouraged patient to do PT exercises given to him. Tramadol acetaminophen refilled 30 tablets. Controlled substance database checked and is appropriate.

## 2017-07-28 NOTE — Assessment & Plan Note (Signed)
Provided ketoconazole shampoo to apply daily, weight 35 minutes then rinse off. Try therapy for 2 weeks. Return to clinic if

## 2017-08-17 NOTE — Progress Notes (Signed)
   [facility] Phone: [phone]   Date of Visit: 08/18/2017   HPI: Arabic Interpreter used for visit: #[id number]  Back Pain: - patient reports that since the last visit, his pain has worsened.  - reports that the pain now radiates bilaterally to his legs. This started about 3 weeks ago. This is constant and worsens with standing up and moving.  - symptoms better with medication (tramadol-tylenol) and heating pad.  - he denies any numbness. Reports that his legs feel heavy and weak  - no urinary incontinence/retention - no bowl incontinence/retentino - reports that the pain is affecting his sleep and PO intake  - has gone to PT (patient had to pay out of pocket) which helped; he is doing home exercises at times  - He has tried Gabapentin and cymbalta in the past without much help  - reports he wants to find a solution for the symptoms and not just take pills for it  - CT lumbar/thoracic spine in in 07/17/2017 was unremarkable.   ROS: See HPI.  PMFSH:  Chronic Back Pain   PHYSICAL EXAM: BP 108/68 (BP Location: Right Arm, Patient Position: Sitting, Cuff Size: Normal)   Pulse 63   Temp 98 F (36.7 C) (Oral)   Ht 5\' 6"  (1.676 m)   Wt 135 lb 3.2 oz (61.3 kg)   SpO2 99%   BMI 21.82 kg/m  GEN: NAD CV: RRR, no murmurs, rubs, or gallops PULM: CTAB, normal effort MSK: tenderness to palpation of the lumbar spine midline and paraspinal muscles, as well as sacral region. Normal ROM of hip but reports of back pain with this. Reports of back pain with FABER test bilaterally. Straight leg raise causes back pain and pain radiating to lower extremity bilaterally. Normal patellar reflexes. Normal sensation to light touch. Normal gait.  EXTR: No lower extremity edema or calf tenderness PSYCH: Mood and affect euthymic, normal rate and volume of speech NEURO: Awake, alert, no focal deficits grossly, normal speech   ASSESSMENT/PLAN:  Chronic low back pain Controlled  substance database reviewed. Discussed that he needs to go to one pharmacy to get medication filled and only one prescriber should be prescribing this medication. Patient understands. Discussed continuing exercises. Has tried gabapentin and cymbalta in the past without much help. Refilled Tramadol-Tylenol. Will refer to orthopedics for further assistance in evaluation.   Palma Holter, MD PGY 3 [facility]

## 2017-08-18 ENCOUNTER — Ambulatory Visit (INDEPENDENT_AMBULATORY_CARE_PROVIDER_SITE_OTHER): Payer: Medicaid Other | Admitting: Internal Medicine

## 2017-08-18 ENCOUNTER — Encounter: Payer: Self-pay | Admitting: Internal Medicine

## 2017-08-18 VITALS — BP 108/68 | HR 63 | Temp 98.0°F | Ht 66.0 in | Wt 135.2 lb

## 2017-08-18 DIAGNOSIS — G8929 Other chronic pain: Secondary | ICD-10-CM | POA: Diagnosis not present

## 2017-08-18 DIAGNOSIS — M5441 Lumbago with sciatica, right side: Secondary | ICD-10-CM | POA: Diagnosis present

## 2017-08-18 DIAGNOSIS — M5442 Lumbago with sciatica, left side: Secondary | ICD-10-CM | POA: Diagnosis not present

## 2017-08-18 MED ORDER — TRAMADOL-ACETAMINOPHEN 37.5-325 MG PO TABS: 1.0000 | ORAL_TABLET | Freq: Three times a day (TID) | ORAL | 0 refills | Status: DC | PRN

## 2017-08-18 NOTE — Patient Instructions (Signed)
I made a referral to orthopedics Follow up in 4 weeks.

## 2017-08-18 NOTE — Assessment & Plan Note (Signed)
Controlled substance database reviewed. Discussed that he needs to go to one pharmacy to get medication filled and only one prescriber should be prescribing this medication. Patient understands. Discussed continuing exercises. Has tried gabapentin and cymbalta in the past without much help. Refilled Tramadol-Tylenol. Will refer to orthopedics for further assistance in evaluation.

## 2017-08-28 ENCOUNTER — Other Ambulatory Visit: Payer: Self-pay | Admitting: Internal Medicine

## 2017-08-28 NOTE — Telephone Encounter (Signed)
Patient came to office wanted to get RX pain med for his back.  Patient stated he is in a lot of pain. Please let patient know [phone]

## 2017-08-31 MED ORDER — TRAMADOL-ACETAMINOPHEN 37.5-325 MG PO TABS: 1.0000 | ORAL_TABLET | Freq: Two times a day (BID) | ORAL | 0 refills | Status: DC | PRN

## 2017-08-31 NOTE — Telephone Encounter (Signed)
Rx printed and placed in front desk. Please inform patient that this prescription needs to last for a month.

## 2017-09-02 ENCOUNTER — Ambulatory Visit (INDEPENDENT_AMBULATORY_CARE_PROVIDER_SITE_OTHER): Payer: Medicaid Other | Admitting: Orthopedic Surgery

## 2017-09-02 ENCOUNTER — Encounter (INDEPENDENT_AMBULATORY_CARE_PROVIDER_SITE_OTHER): Payer: Self-pay | Admitting: Orthopedic Surgery

## 2017-09-02 DIAGNOSIS — G8929 Other chronic pain: Secondary | ICD-10-CM | POA: Diagnosis not present

## 2017-09-02 DIAGNOSIS — M5441 Lumbago with sciatica, right side: Secondary | ICD-10-CM | POA: Diagnosis not present

## 2017-09-02 DIAGNOSIS — M5442 Lumbago with sciatica, left side: Secondary | ICD-10-CM

## 2017-09-02 MED ORDER — METHOCARBAMOL 500 MG PO TABS: 500.0000 mg | ORAL_TABLET | Freq: Two times a day (BID) | ORAL | 0 refills | Status: DC | PRN

## 2017-09-02 MED ORDER — TRAMADOL HCL 50 MG PO TABS: ORAL_TABLET | ORAL | 0 refills | Status: DC

## 2017-09-02 MED ORDER — MELOXICAM 15 MG PO TABS: 15.0000 mg | ORAL_TABLET | Freq: Every day | ORAL | 0 refills | Status: DC | PRN
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
## 2017-09-23 ENCOUNTER — Ambulatory Visit (INDEPENDENT_AMBULATORY_CARE_PROVIDER_SITE_OTHER): Payer: Medicaid Other | Admitting: Family Medicine

## 2017-09-23 VITALS — BP 110/80 | HR 75 | Temp 97.6°F | Ht 66.0 in | Wt 135.8 lb

## 2017-09-23 DIAGNOSIS — M5441 Lumbago with sciatica, right side: Secondary | ICD-10-CM | POA: Diagnosis not present

## 2017-09-23 DIAGNOSIS — M5442 Lumbago with sciatica, left side: Secondary | ICD-10-CM | POA: Diagnosis present

## 2017-09-23 DIAGNOSIS — G8929 Other chronic pain: Secondary | ICD-10-CM | POA: Diagnosis not present

## 2017-09-23 MED ORDER — TROLAMINE SALICYLATE 10 % EX LOTN: 1.0000 "application " | TOPICAL_LOTION | Freq: Four times a day (QID) | CUTANEOUS | 2 refills | Status: DC | PRN

## 2017-09-23 MED ORDER — TRAMADOL HCL 50 MG PO TABS: ORAL_TABLET | ORAL | 0 refills | Status: DC

## 2017-09-23 MED ORDER — CYCLOBENZAPRINE HCL 10 MG PO TABS: 10.0000 mg | ORAL_TABLET | Freq: Three times a day (TID) | ORAL | 1 refills | Status: DC | PRN
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
## 2017-09-23 NOTE — Patient Instructions (Signed)
Thank you for coming in today, it was so nice to see you! Today we talked about:    Back pain: Continue taking the Tramadol only as needed.   Stop taking robaxin. Start Flexeril, you can take this up to 3 times a day  Continue to take Tylenol   I have sent a prescription for a pain cream to your pharmacy  If you have any questions or concerns, please do not hesitate to call the office at [phone]. You can also message me directly via MyChart.   Sincerely,  Anders Simmonds, MD
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
## 2017-10-07 ENCOUNTER — Ambulatory Visit (INDEPENDENT_AMBULATORY_CARE_PROVIDER_SITE_OTHER): Payer: Medicaid Other | Admitting: Family Medicine

## 2017-10-07 ENCOUNTER — Encounter: Payer: Self-pay | Admitting: Family Medicine

## 2017-10-07 VITALS — BP 110/78 | HR 58 | Temp 97.7°F | Wt 137.0 lb

## 2017-10-07 DIAGNOSIS — G8929 Other chronic pain: Secondary | ICD-10-CM | POA: Diagnosis not present

## 2017-10-07 DIAGNOSIS — M545 Low back pain: Secondary | ICD-10-CM | POA: Diagnosis present

## 2017-10-07 MED ORDER — TRAMADOL HCL 50 MG PO TABS: ORAL_TABLET | ORAL | 0 refills | Status: DC

## 2017-10-07 NOTE — Patient Instructions (Signed)
It was great to see you!  For your back pain,  - I am refilling your Tramadol to take as needed for pain. This is not to be taken around the clock, only when needed. - You have an MRI ordered that has not been completed - I am including some exercises to try that can strengthen your back muscles and may help your back pain. - I think working with Physical Therapy will help your back pain. - Someone from Orthopedic Surgery will be calling you about an appointment.  Take care and seek immediate care sooner if you develop any concerns.   Ellwood Dense, DO [facility]   Back Exercises If you have pain in your back, do these exercises 2-3 times each day or as told by your doctor. When the pain goes away, do the exercises once each day, but repeat the steps more times for each exercise (do more repetitions). If you do not have pain in your back, do these exercises once each day or as told by your doctor. Exercises Single Knee to Chest  Do these steps 3-5 times in a row for each leg: 1. Lie on your back on a firm bed or the floor with your legs stretched out. 2. Bring one knee to your chest. 3. Hold your knee to your chest by grabbing your knee or thigh. 4. Pull on your knee until you feel a gentle stretch in your lower back. 5. Keep doing the stretch for 10-30 seconds. 6. Slowly let go of your leg and straighten it.  Pelvic Tilt  Do these steps 5-10 times in a row: 1. Lie on your back on a firm bed or the floor with your legs stretched out. 2. Bend your knees so they point up to the ceiling. Your feet should be flat on the floor. 3. Tighten your lower belly (abdomen) muscles to press your lower back against the floor. This will make your tailbone point up to the ceiling instead of pointing down to your feet or the floor. 4. Stay in this position for 5-10 seconds while you gently tighten your muscles and breathe evenly.  Cat-Cow  Do these steps until your lower back bends  more easily: 1. Get on your hands and knees on a firm surface. Keep your hands under your shoulders, and keep your knees under your hips. You may put padding under your knees. 2. Let your head hang down, and make your tailbone point down to the floor so your lower back is round like the back of a cat. 3. Stay in this position for 5 seconds. 4. Slowly lift your head and make your tailbone point up to the ceiling so your back hangs low (sags) like the back of a cow. 5. Stay in this position for 5 seconds.  Press-Ups  Do these steps 5-10 times in a row: 1. Lie on your belly (face-down) on the floor. 2. Place your hands near your head, about shoulder-width apart. 3. While you keep your back relaxed and keep your hips on the floor, slowly straighten your arms to raise the top half of your body and lift your shoulders. Do not use your back muscles. To make yourself more comfortable, you may change where you place your hands. 4. Stay in this position for 5 seconds. 5. Slowly return to lying flat on the floor.  Bridges  Do these steps 10 times in a row: 1. Lie on your back on a firm surface. 2. [place] your  knees so they point up to the ceiling. Your feet should be flat on the floor. 3. Tighten your butt muscles and lift your butt off of the floor until your waist is almost as high as your knees. If you do not feel the muscles working in your butt and the back of your thighs, slide your feet 1-2 inches farther away from your butt. 4. Stay in this position for 3-5 seconds. 5. Slowly lower your butt to the floor, and let your butt muscles relax.  If this exercise is too easy, try doing it with your arms crossed over your chest. Belly Crunches  Do these steps 5-10 times in a row: 1. Lie on your back on a firm bed or the floor with your legs stretched out. 2. Bend your knees so they point up to the ceiling. Your feet should be flat on the floor. 3. Cross your arms over your chest. 4. Tip your chin a  little bit toward your chest but do not bend your neck. 5. Tighten your belly muscles and slowly raise your chest just enough to lift your shoulder blades a tiny bit off of the floor. 6. Slowly lower your chest and your head to the floor.  Back Lifts Do these steps 5-10 times in a row: 1. Lie on your belly (face-down) with your arms at your sides, and rest your forehead on the floor. 2. Tighten the muscles in your legs and your butt. 3. Slowly lift your chest off of the floor while you keep your hips on the floor. Keep the back of your head in line with the curve in your back. Look at the floor while you do this. 4. Stay in this position for 3-5 seconds. 5. Slowly lower your chest and your face to the floor.  Contact a doctor if:  Your back pain gets a lot worse when you do an exercise.  Your back pain does not lessen 2 hours after you exercise. If you have any of these problems, stop doing the exercises. Do not do them again unless your doctor says it is okay. Get help right away if:  You have sudden, very bad back pain. If this happens, stop doing the exercises. Do not do them again unless your doctor says it is okay. This information is not intended to replace advice given to you by your health care provider. Make sure you discuss any questions you have with your health care provider. Document Released: 01/10/2011 Document Revised: 05/15/2016 Document Reviewed: 02/01/2015 Elsevier Interactive Patient Education  [employer]2018 [employer]
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
## 2017-10-20 ENCOUNTER — Encounter (HOSPITAL_COMMUNITY): Payer: Self-pay | Admitting: Emergency Medicine

## 2017-10-20 ENCOUNTER — Ambulatory Visit (HOSPITAL_COMMUNITY)
Admission: EM | Admit: 2017-10-20 | Discharge: 2017-10-20 | Disposition: A | Discharge status: Home / Self Care | Payer: Medicaid Other | Attending: Emergency Medicine | Admitting: Emergency Medicine

## 2017-10-20 DIAGNOSIS — M545 Low back pain, unspecified: Secondary | ICD-10-CM

## 2017-10-20 DIAGNOSIS — M5442 Lumbago with sciatica, left side: Secondary | ICD-10-CM | POA: Diagnosis not present

## 2017-10-20 DIAGNOSIS — M5441 Lumbago with sciatica, right side: Secondary | ICD-10-CM

## 2017-10-20 DIAGNOSIS — G8929 Other chronic pain: Secondary | ICD-10-CM | POA: Diagnosis not present

## 2017-10-20 MED ORDER — TRAMADOL HCL 50 MG PO TABS: ORAL_TABLET | ORAL | 0 refills | Status: DC
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
## 2017-10-20 NOTE — Discharge Instructions (Signed)
I am giving you a one-time short prescription of your pain medicine, but all further refills must come from your primary care physician or your orthopedic surgeon specialist.  You will not be able to get any further refills here at the urgent care or in the ER.  Continue the heat.  I will send a note to your doctor letting them know that you were here and to please expedite the referral process.  Go to the ER for fevers above 100.4, if you accidentally urinate on yourself, defecate on your self, if you have numbness in between your thighs, pain not controlled with medications or for other concerns.

## 2017-10-26 ENCOUNTER — Ambulatory Visit: Payer: Medicaid Other

## 2017-10-26 ENCOUNTER — Telehealth: Payer: Self-pay | Admitting: *Deleted

## 2017-10-26 ENCOUNTER — Ambulatory Visit (INDEPENDENT_AMBULATORY_CARE_PROVIDER_SITE_OTHER): Payer: Medicaid Other | Admitting: Family Medicine

## 2017-10-26 ENCOUNTER — Encounter: Payer: Self-pay | Admitting: Family Medicine

## 2017-10-26 DIAGNOSIS — G8929 Other chronic pain: Secondary | ICD-10-CM | POA: Diagnosis not present

## 2017-10-26 DIAGNOSIS — M545 Low back pain: Secondary | ICD-10-CM | POA: Diagnosis not present

## 2017-10-26 MED ORDER — TRAMADOL HCL 50 MG PO TABS: ORAL_TABLET | ORAL | 0 refills | Status: DC

## 2017-10-26 NOTE — Patient Instructions (Signed)
Please follow up with Dr. August Saucerean with any questions.   If you have questions or concerns please do not hesitate to call at [phone].  Dolores PattyAngela Venesha Petraitis, DO PGY-2, [facility] 10/26/2017 4:45 PM    Back Exercises If you have pain in your back, do these exercises 2-3 times each day or as told by your doctor. When the pain goes away, do the exercises once each day, but repeat the steps more times for each exercise (do more repetitions). If you do not have pain in your back, do these exercises once each day or as told by your doctor. Exercises Single Knee to Chest  Do these steps 3-5 times in a row for each leg: 1. Lie on your back on a firm bed or the floor with your legs stretched out. 2. Bring one knee to your chest. 3. Hold your knee to your chest by grabbing your knee or thigh. 4. Pull on your knee until you feel a gentle stretch in your lower back. 5. Keep doing the stretch for 10-30 seconds. 6. Slowly let go of your leg and straighten it.  Pelvic Tilt  Do these steps 5-10 times in a row: 1. Lie on your back on a firm bed or the floor with your legs stretched out. 2. Bend your knees so they point up to the ceiling. Your feet should be flat on the floor. 3. Tighten your lower belly (abdomen) muscles to press your lower back against the floor. This will make your tailbone point up to the ceiling instead of pointing down to your feet or the floor. 4. Stay in this position for 5-10 seconds while you gently tighten your muscles and breathe evenly.  Cat-Cow  Do these steps until your lower back bends more easily: 1. Get on your hands and knees on a firm surface. Keep your hands under your shoulders, and keep your knees under your hips. You may put padding under your knees. 2. Let your head hang down, and make your tailbone point down to the floor so your lower back is round like the back of a cat. 3. Stay in this position for 5 seconds. 4. Slowly lift your head and make  your tailbone point up to the ceiling so your back hangs low (sags) like the back of a cow. 5. Stay in this position for 5 seconds.  Press-Ups  Do these steps 5-10 times in a row: 1. Lie on your belly (face-down) on the floor. 2. Place your hands near your head, about shoulder-width apart. 3. While you keep your back relaxed and keep your hips on the floor, slowly straighten your arms to raise the top half of your body and lift your shoulders. Do not use your back muscles. To make yourself more comfortable, you may change where you place your hands. 4. Stay in this position for 5 seconds. 5. Slowly return to lying flat on the floor.  Bridges  Do these steps 10 times in a row: 1. Lie on your back on a firm surface. 2. Bend your knees so they point up to the ceiling. Your feet should be flat on the floor. 3. Tighten your butt muscles and lift your butt off of the floor until your waist is almost as high as your knees. If you do not feel the muscles working in your butt and the back of your thighs, slide your feet 1-2 inches farther away from your butt. 4. Stay in this position for 3-5 seconds.  5. Slowly lower your butt to the floor, and let your butt muscles relax.  If this exercise is too easy, try doing it with your arms crossed over your chest. Belly Crunches  Do these steps 5-10 times in a row: 1. Lie on your back on a firm bed or the floor with your legs stretched out. 2. Bend your knees so they point up to the ceiling. Your feet should be flat on the floor. 3. Cross your arms over your chest. 4. Tip your chin a little bit toward your chest but do not bend your neck. 5. Tighten your belly muscles and slowly raise your chest just enough to lift your shoulder blades a tiny bit off of the floor. 6. Slowly lower your chest and your head to the floor.  Back Lifts Do these steps 5-10 times in a row: 1. Lie on your belly (face-down) with your arms at your sides, and rest your forehead  on the floor. 2. Tighten the muscles in your legs and your butt. 3. Slowly lift your chest off of the floor while you keep your hips on the floor. Keep the back of your head in line with the curve in your back. Look at the floor while you do this. 4. Stay in this position for 3-5 seconds. 5. Slowly lower your chest and your face to the floor.  Contact a doctor if:  Your back pain gets a lot worse when you do an exercise.  Your back pain does not lessen 2 hours after you exercise. If you have any of these problems, stop doing the exercises. Do not do them again unless your doctor says it is okay. Get help right away if:  You have sudden, very bad back pain. If this happens, stop doing the exercises. Do not do them again unless your doctor says it is okay. This information is not intended to replace advice given to you by your health care provider. Make sure you discuss any questions you have with your health care provider. Document Released: 01/10/2011 Document Revised: 05/15/2016 Document Reviewed: 02/01/2015 Elsevier Interactive Patient Education  [employer].
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
## 2017-10-26 NOTE — Telephone Encounter (Signed)
Patient has an appointment today with Dr. Wonda Oldsiccio to discuss his back pain.  Will inform at that time regarding this second referral. Jazmin Hartsell,CMA

## 2017-10-26 NOTE — Telephone Encounter (Signed)
-----   Message from Palma HolterKanishka G Gunadasa, MD sent at 10/22/2017  5:20 PM EDT ----- Please call patient and inform him of the following: we looked into why the second orthopedic office has not gotten in touch with you. Per referral coordinator, Referred to [facility] on 10/3 and referral was declined by all their MDs. Patient will need to return to [facility].   ----- Message ----- From: Luther ParodyHill, Tia C Sent: 10/21/2017   8:34 AM To: Palma HolterKanishka G Gunadasa, MD  Patient was referred to [facility] in 07/2017 and seen by Dr. August Saucerean on 09/02/17. Then referred again because he did not like Dr. August Saucerean. Referred to [facility] on 10/3 and referral was declined by all their MDs. Patient will need to return to [facility].   ----- Message ----- From: [name], Kanishka G, MD Sent: 10/21/2017   7:58 AM To: Tia C Hill  Hi Tia,   Per chart review, it looks like we referred him to ortho for a second opinion early this month. It looks like he has not heard back from them. Could you please look into this?  Thank you!  Venancio PoissonKanishka

## 2017-10-27 ENCOUNTER — Encounter: Payer: Self-pay | Admitting: Family Medicine

## 2017-10-27 NOTE — Assessment & Plan Note (Addendum)
  Chronic back pain, imaging has been negative, no red flag symptoms. Discussed with patient that second referral was declined by other ortho office. Asked him to see Dr. August Saucerean again to discuss his options. Reviewed back exercises and handout given to patient. Patient should have a refill of flexeril left from last month's prescription, asked him to refill this at pharmacy.  -refill for 50 mg Tramadol, 30 pills given with 0 refills -follow up with PCP and ortho as needed

## 2017-11-09 ENCOUNTER — Ambulatory Visit: Payer: Medicaid Other | Admitting: Internal Medicine

## 2017-11-09 ENCOUNTER — Other Ambulatory Visit: Payer: Self-pay

## 2017-11-09 ENCOUNTER — Encounter: Payer: Self-pay | Admitting: Internal Medicine

## 2017-11-09 DIAGNOSIS — M545 Low back pain: Secondary | ICD-10-CM

## 2017-11-09 DIAGNOSIS — G8929 Other chronic pain: Secondary | ICD-10-CM

## 2017-11-09 MED ORDER — TRAMADOL HCL 50 MG PO TABS: ORAL_TABLET | ORAL | 0 refills | Status: DC

## 2017-11-09 MED ORDER — DULOXETINE HCL 30 MG PO CPEP: 30.0000 mg | ORAL_CAPSULE | Freq: Every day | ORAL | 1 refills | Status: DC
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
## 2017-11-09 NOTE — Assessment & Plan Note (Addendum)
Reports worsening pain since last visit. Did not follow any recommendations from last visit. Now with symptoms consistent with sciatica bilaterally. No red flags. Will begin Cymbalta today to try to alleviate at least sciatic symptoms. Patient wishing to see if Cymbalta is effective before scheduling another ortho appt. Feel that this is appropriate, however informed patient that would not be given any additional Tramadol refills after today until he follows up with his PCP in about a month. Patient voiced understanding.  - Begin Cymbalta 30mg  qhs today. Increase to 60mg  qhs in one week.  - Can continue Tramadol q8-q12 PRN and Flexeril for breakthrough pain - Schedule f/u appt with Dr. Ottie GlazierGunadasa in one month - Recommend encouraging patient to schedule f/u appt at Dr. Diamantina Providenceean's office if no pain relief with Cymbalta at next visit. Explained to patient that there are likely other providers at Dr. Diamantina Providenceean's office, and he can ask to be seen by one of them. Also informed patient that we cannot schedule the appointment for him, as we do not have access to Dr. Diamantina Providenceean's scheduling system, and that he will have to do this himself. Recommended going to the office in person if they do not have phone translating services available.  - Tramadol #30 with no refills prescribed today - Should have enough Flexeril from initial prescription, so not refilled today

## 2017-11-09 NOTE — Patient Instructions (Signed)
It was nice meeting you today Todd Miller!  For your back pain, we are starting a new medicine called Cymbalta today. You will begin by taking one tablet at night (30 mg) for the next 7 days. After that, you can increase to two tablets at night (60 mg total). You can continue taking Tramadol and Flexeril as needed if your pain is not controlled by Cymbalta.   Please be sure to come to your follow-up appointment with your primary doctor, Dr. Ottie GlazierGunadasa, in about a month to see how your pain is.   If you have any questions or concerns, please feel free to call the clinic.   Be well,  Dr. Natale MilchLancaster

## 2017-11-18 ENCOUNTER — Ambulatory Visit: Payer: Medicaid Other | Admitting: Student

## 2017-11-18 ENCOUNTER — Encounter: Payer: Self-pay | Admitting: Student

## 2017-11-18 ENCOUNTER — Other Ambulatory Visit: Payer: Self-pay

## 2017-11-18 DIAGNOSIS — M5441 Lumbago with sciatica, right side: Secondary | ICD-10-CM | POA: Diagnosis not present

## 2017-11-18 DIAGNOSIS — M5442 Lumbago with sciatica, left side: Secondary | ICD-10-CM | POA: Diagnosis present

## 2017-11-18 DIAGNOSIS — G8929 Other chronic pain: Secondary | ICD-10-CM

## 2017-11-18 MED ORDER — TRAMADOL HCL 50 MG PO TABS: ORAL_TABLET | ORAL | 0 refills | Status: DC

## 2017-11-18 NOTE — Patient Instructions (Signed)
It was great seeing you today! We have addressed the following issues today  Back pain: We gave you a prescription for tramadol that you can fill on 11/21/2017.  This will get you to you appointment with orthopedics.  I also recommend resuming the Cymbalta.  Start with 1 capsule a day.  You may increase to 2 capsules of the 2 weeks.  I also recommend daily walking and stretching as we discussed in the office.  You may try heating pad as well. If we did any lab work today, and the results require attention, either me or my nurse will get in touch with you. If everything is normal, you will get a letter in mail and a message via . If you don't hear from us in two weeks, please give us a call. Otherwise, we look forward to seeing you again at your next visit. If you have any questions or concerns before then, please call the clinic at [phone].  Please bring all your medications to every doctors visit  Sign up for My Chart to have easy access to your labs results, and communication with your Primary care physician.    Please check-out at the front desk before leaving the clinic.    Take Care,   Dr. Alanda SlimGonfa   Back Exercises If you have pain in your back, do these exercises 2-3 times each day or as told by your doctor. When the pain goes away, do the exercises once each day, but repeat the steps more times for each exercise (do more repetitions). If you do not have pain in your back, do these exercises once each day or as told by your doctor. Exercises Single Knee to Chest  Do these steps 3-5 times in a row for each leg: 2. Lie on your back on a firm bed or the floor with your legs stretched out. 3. Bring one knee to your chest. 4. Hold your knee to your chest by grabbing your knee or thigh. 5. Pull on your knee until you feel a gentle stretch in your lower back. 6. Keep doing the stretch for 10-30 seconds. 7. Slowly let go of your leg and straighten it.  Pelvic Tilt  Do these steps  5-10 times in a row: 1. Lie on your back on a firm bed or the floor with your legs stretched out. 2. Bend your knees so they point up to the ceiling. Your feet should be flat on the floor. 3. Tighten your lower belly (abdomen) muscles to press your lower back against the floor. This will make your tailbone point up to the ceiling instead of pointing down to your feet or the floor. 4. Stay in this position for 5-10 seconds while you gently tighten your muscles and breathe evenly.  Cat-Cow  Do these steps until your lower back bends more easily: 1. Get on your hands and knees on a firm surface. Keep your hands under your shoulders, and keep your knees under your hips. You may put padding under your knees. 2. Let your head hang down, and make your tailbone point down to the floor so your lower back is round like the back of a cat. 3. Stay in this position for 5 seconds. 4. Slowly lift your head and make your tailbone point up to the ceiling so your back hangs low (sags) like the back of a cow. 5. Stay in this position for 5 seconds.  Press-Ups  Do these steps 5-10 times in a row:  1. Lie on your belly (face-down) on the floor. 2. Place your hands near your head, about shoulder-width apart. 3. While you keep your back relaxed and keep your hips on the floor, slowly straighten your arms to raise the top half of your body and lift your shoulders. Do not use your back muscles. To make yourself more comfortable, you may change where you place your hands. 4. Stay in this position for 5 seconds. 5. Slowly return to lying flat on the floor.  Bridges  Do these steps 10 times in a row: 1. Lie on your back on a firm surface. 2. Bend your knees so they point up to the ceiling. Your feet should be flat on the floor. 3. Tighten your butt muscles and lift your butt off of the floor until your waist is almost as high as your knees. If you do not feel the muscles working in your butt and the back of your  thighs, slide your feet 1-2 inches farther away from your butt. 4. Stay in this position for 3-5 seconds. 5. Slowly lower your butt to the floor, and let your butt muscles relax.  If this exercise is too easy, try doing it with your arms crossed over your chest. Belly Crunches  Do these steps 5-10 times in a row: 1. Lie on your back on a firm bed or the floor with your legs stretched out. 2. Bend your knees so they point up to the ceiling. Your feet should be flat on the floor. 3. Cross your arms over your chest. 4. Tip your chin a little bit toward your chest but do not bend your neck. 5. Tighten your belly muscles and slowly raise your chest just enough to lift your shoulder blades a tiny bit off of the floor. 6. Slowly lower your chest and your head to the floor.  Back Lifts Do these steps 5-10 times in a row: 1. Lie on your belly (face-down) with your arms at your sides, and rest your forehead on the floor. 2. Tighten the muscles in your legs and your butt. 3. Slowly lift your chest off of the floor while you keep your hips on the floor. Keep the back of your head in line with the curve in your back. Look at the floor while you do this. 4. Stay in this position for 3-5 seconds. 5. Slowly lower your chest and your face to the floor.  Contact a doctor if:  Your back pain gets a lot worse when you do an exercise.  Your back pain does not lessen 2 hours after you exercise. If you have any of these problems, stop doing the exercises. Do not do them again unless your doctor says it is okay. Get help right away if:  You have sudden, very bad back pain. If this happens, stop doing the exercises. Do not do them again unless your doctor says it is okay. This information is not intended to replace advice given to you by your health care provider. Make sure you discuss any questions you have with your health care provider. Document Released: 01/10/2011 Document Revised: 05/15/2016 Document  Reviewed: 02/01/2015 Elsevier Interactive Patient Education  [employer]
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
## 2017-11-30 ENCOUNTER — Ambulatory Visit: Payer: Medicaid Other | Admitting: Family Medicine

## 2017-12-01 ENCOUNTER — Other Ambulatory Visit: Payer: Self-pay | Admitting: Student

## 2017-12-01 DIAGNOSIS — M5441 Lumbago with sciatica, right side: Principal | ICD-10-CM

## 2017-12-01 DIAGNOSIS — G8929 Other chronic pain: Secondary | ICD-10-CM

## 2017-12-01 DIAGNOSIS — M5442 Lumbago with sciatica, left side: Principal | ICD-10-CM

## 2017-12-02 ENCOUNTER — Encounter (INDEPENDENT_AMBULATORY_CARE_PROVIDER_SITE_OTHER): Payer: Self-pay

## 2017-12-02 ENCOUNTER — Other Ambulatory Visit: Payer: Self-pay

## 2017-12-02 ENCOUNTER — Ambulatory Visit (INDEPENDENT_AMBULATORY_CARE_PROVIDER_SITE_OTHER): Payer: Medicaid Other | Admitting: Orthopedic Surgery

## 2017-12-02 ENCOUNTER — Ambulatory Visit: Payer: Medicaid Other | Admitting: Family Medicine

## 2017-12-02 ENCOUNTER — Encounter: Payer: Self-pay | Admitting: Family Medicine

## 2017-12-02 VITALS — BP 120/72 | HR 82 | Temp 98.3°F | Ht 66.0 in | Wt 139.0 lb

## 2017-12-02 DIAGNOSIS — M5441 Lumbago with sciatica, right side: Secondary | ICD-10-CM

## 2017-12-02 DIAGNOSIS — G8929 Other chronic pain: Secondary | ICD-10-CM | POA: Diagnosis not present

## 2017-12-02 DIAGNOSIS — M5442 Lumbago with sciatica, left side: Secondary | ICD-10-CM

## 2017-12-02 MED ORDER — AMITRIPTYLINE HCL 25 MG PO TABS: 25.0000 mg | ORAL_TABLET | Freq: Every day | ORAL | 0 refills | Status: AC

## 2017-12-02 MED ORDER — TRAMADOL HCL 50 MG PO TABS: ORAL_TABLET | ORAL | 0 refills | Status: DC
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
## 2017-12-02 NOTE — Patient Instructions (Signed)
Thank you for coming in to see us today. Please see below to review our plan for today's visit.  Please discontinue the Cymbalta and start amitriptyline 25 mg nightly before bedtime.  I have given you additional tramadol to help you get through the next few days until the amitriptyline starts to work.  This may take a couple of weeks.  Call Dr. August Saucerean and schedule a follow-up appointment.  You can also supplement with Tylenol with ibuprofen every 8 hours as needed for pain.  It is important that you remain active to help prevent worsening of her back pain.  Please call the clinic at [phone] if your symptoms worsen or you have any concerns. It was our pleasure to serve you.  Durward Parcelavid Darrius Montano, DO [facility], PGY-2
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
## 2017-12-03 ENCOUNTER — Ambulatory Visit: Payer: Medicaid Other | Admitting: Internal Medicine
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
## 2017-12-25 ENCOUNTER — Other Ambulatory Visit: Payer: Self-pay | Admitting: Internal Medicine

## 2017-12-25 DIAGNOSIS — M5441 Lumbago with sciatica, right side: Principal | ICD-10-CM

## 2017-12-25 DIAGNOSIS — G8929 Other chronic pain: Secondary | ICD-10-CM

## 2017-12-25 DIAGNOSIS — M5442 Lumbago with sciatica, left side: Principal | ICD-10-CM

## 2017-12-28 ENCOUNTER — Ambulatory Visit: Payer: Medicaid Other | Admitting: Family Medicine

## 2017-12-29 NOTE — Progress Notes (Deleted)
   [facility] Phone: [phone]   Date of Visit: 12/30/2017   HPI:  ***  ROS: See HPI.  PMFSH: ***  PHYSICAL EXAM: There were no vitals taken for this visit. Gen: *** HEENT: *** Heart: *** Lungs: *** Neuro: *** Ext: ***  ASSESSMENT/PLAN:  Health maintenance:  -***  No problem-specific Assessment & Plan notes found for this encounter.  FOLLOW UP: Follow up in *** for ***  Palma HolterKanishka G Jacqualin Shirkey, MD PGY 3 [facility]

## 2017-12-30 ENCOUNTER — Ambulatory Visit: Payer: Medicaid Other | Admitting: Internal Medicine

## 2018-01-14 ENCOUNTER — Ambulatory Visit: Payer: Medicaid Other | Admitting: Family Medicine

## 2018-01-14 ENCOUNTER — Encounter: Payer: Self-pay | Admitting: Family Medicine

## 2018-01-14 ENCOUNTER — Other Ambulatory Visit: Payer: Self-pay

## 2018-01-14 VITALS — BP 100/70 | HR 68 | Temp 98.7°F | Ht 66.0 in | Wt 138.0 lb

## 2018-01-14 DIAGNOSIS — M5442 Lumbago with sciatica, left side: Secondary | ICD-10-CM | POA: Diagnosis present

## 2018-01-14 DIAGNOSIS — M5441 Lumbago with sciatica, right side: Secondary | ICD-10-CM

## 2018-01-14 DIAGNOSIS — G8929 Other chronic pain: Secondary | ICD-10-CM

## 2018-01-14 MED ORDER — TRAMADOL HCL 50 MG PO TABS: 50.0000 mg | ORAL_TABLET | Freq: Three times a day (TID) | ORAL | 0 refills | Status: DC | PRN

## 2018-01-14 NOTE — Patient Instructions (Signed)
It was great seeing you today! We have addressed the following issues today  1. Please make sure you go to the Orthopedic surgeon appointment.   If we did any lab work today, and the results require attention, either me or my nurse will get in touch with you. If everything is normal, you will get a letter in mail and a message via . If you don't hear from us in two weeks, please give us a call. Otherwise, we look forward to seeing you again at your next visit. If you have any questions or concerns before then, please call the clinic at [phone].  Please bring all your medications to every doctors visit  Sign up for My Chart to have easy access to your labs results, and communication with your Primary care physician. Please ask Front Desk for some assistance.   Please check-out at the front desk before leaving the clinic.    Take Care,   Dr. Sydnee Cabaliallo
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
## 2018-01-27 ENCOUNTER — Encounter (INDEPENDENT_AMBULATORY_CARE_PROVIDER_SITE_OTHER): Payer: Self-pay | Admitting: Orthopaedic Surgery

## 2018-01-27 ENCOUNTER — Ambulatory Visit (INDEPENDENT_AMBULATORY_CARE_PROVIDER_SITE_OTHER): Payer: Medicaid Other

## 2018-01-27 ENCOUNTER — Ambulatory Visit (INDEPENDENT_AMBULATORY_CARE_PROVIDER_SITE_OTHER): Payer: Medicaid Other | Admitting: Orthopaedic Surgery

## 2018-01-27 DIAGNOSIS — M545 Low back pain: Secondary | ICD-10-CM | POA: Diagnosis not present

## 2018-01-27 DIAGNOSIS — G8929 Other chronic pain: Secondary | ICD-10-CM

## 2018-01-27 MED ORDER — METHYLPREDNISOLONE 4 MG PO TABS: ORAL_TABLET | ORAL | 0 refills | Status: DC

## 2018-01-27 MED ORDER — TIZANIDINE HCL 4 MG PO TABS: 4.0000 mg | ORAL_TABLET | Freq: Three times a day (TID) | ORAL | 0 refills | Status: DC | PRN

## 2018-01-27 MED ORDER — HYDROCODONE-ACETAMINOPHEN 5-325 MG PO TABS: 1.0000 | ORAL_TABLET | Freq: Four times a day (QID) | ORAL | 0 refills | Status: DC | PRN
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
## 2018-01-29 ENCOUNTER — Other Ambulatory Visit (INDEPENDENT_AMBULATORY_CARE_PROVIDER_SITE_OTHER): Payer: Self-pay

## 2018-01-29 DIAGNOSIS — M4807 Spinal stenosis, lumbosacral region: Secondary | ICD-10-CM

## 2018-02-08 ENCOUNTER — Telehealth (INDEPENDENT_AMBULATORY_CARE_PROVIDER_SITE_OTHER): Payer: Self-pay | Admitting: Orthopaedic Surgery

## 2018-02-08 NOTE — Telephone Encounter (Signed)
Todd Miller refused refill, patient aware

## 2018-02-08 NOTE — Telephone Encounter (Signed)
Rx refill Hydrocodone 5-325mg °

## 2018-02-10 ENCOUNTER — Ambulatory Visit (INDEPENDENT_AMBULATORY_CARE_PROVIDER_SITE_OTHER): Payer: Medicaid Other | Admitting: Orthopaedic Surgery

## 2018-02-16 ENCOUNTER — Ambulatory Visit
Admission: RE | Admit: 2018-02-16 | Discharge: 2018-02-16 | Disposition: A | Discharge status: Home / Self Care | Payer: Medicaid Other | Source: Ambulatory Visit | Attending: Orthopaedic Surgery | Admitting: Orthopaedic Surgery

## 2018-02-16 DIAGNOSIS — M4807 Spinal stenosis, lumbosacral region: Secondary | ICD-10-CM

## 2018-02-17 ENCOUNTER — Ambulatory Visit (INDEPENDENT_AMBULATORY_CARE_PROVIDER_SITE_OTHER): Payer: Medicaid Other | Admitting: Orthopaedic Surgery

## 2018-02-17 ENCOUNTER — Encounter (INDEPENDENT_AMBULATORY_CARE_PROVIDER_SITE_OTHER): Payer: Self-pay | Admitting: Orthopaedic Surgery

## 2018-02-17 DIAGNOSIS — M5441 Lumbago with sciatica, right side: Secondary | ICD-10-CM

## 2018-02-17 DIAGNOSIS — G8929 Other chronic pain: Secondary | ICD-10-CM

## 2018-02-17 DIAGNOSIS — M5442 Lumbago with sciatica, left side: Secondary | ICD-10-CM | POA: Diagnosis not present

## 2018-02-17 MED ORDER — ACETAMINOPHEN-CODEINE #3 300-30 MG PO TABS: 1.0000 | ORAL_TABLET | Freq: Three times a day (TID) | ORAL | 0 refills | Status: DC | PRN

## 2018-02-17 MED ORDER — GABAPENTIN 100 MG PO CAPS: 100.0000 mg | ORAL_CAPSULE | Freq: Three times a day (TID) | ORAL | 1 refills | Status: AC

## 2018-02-17 MED ORDER — NAPROXEN 500 MG PO TABS: 500.0000 mg | ORAL_TABLET | Freq: Two times a day (BID) | ORAL | 1 refills | Status: DC | PRN

## 2018-02-17 MED ORDER — TIZANIDINE HCL 4 MG PO TABS: 4.0000 mg | ORAL_TABLET | Freq: Three times a day (TID) | ORAL | 0 refills | Status: DC | PRN
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
## 2018-04-28 ENCOUNTER — Ambulatory Visit: Payer: Medicaid Other | Admitting: Neurology

## 2018-05-03 ENCOUNTER — Other Ambulatory Visit: Payer: Self-pay

## 2018-05-03 ENCOUNTER — Ambulatory Visit: Payer: Medicaid Other | Admitting: Neurology

## 2018-05-03 ENCOUNTER — Encounter: Payer: Self-pay | Admitting: Neurology

## 2018-05-03 VITALS — BP 118/83 | HR 64 | Resp 14 | Ht 66.0 in | Wt 129.0 lb

## 2018-05-03 DIAGNOSIS — M5442 Lumbago with sciatica, left side: Secondary | ICD-10-CM

## 2018-05-03 DIAGNOSIS — G8929 Other chronic pain: Secondary | ICD-10-CM | POA: Diagnosis not present

## 2018-05-03 DIAGNOSIS — M5441 Lumbago with sciatica, right side: Secondary | ICD-10-CM

## 2018-05-03 NOTE — Patient Instructions (Signed)
   Would consider an SI joint injection to help diagnose the source of the pain.
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
## 2018-07-27 ENCOUNTER — Ambulatory Visit: Payer: Medicaid Other | Admitting: Family Medicine

## 2018-07-27 ENCOUNTER — Encounter: Payer: Self-pay | Admitting: Family Medicine

## 2018-07-27 ENCOUNTER — Other Ambulatory Visit: Payer: Self-pay

## 2018-07-27 DIAGNOSIS — M5441 Lumbago with sciatica, right side: Secondary | ICD-10-CM | POA: Diagnosis not present

## 2018-07-27 DIAGNOSIS — G8929 Other chronic pain: Secondary | ICD-10-CM | POA: Diagnosis not present

## 2018-07-27 DIAGNOSIS — M5442 Lumbago with sciatica, left side: Secondary | ICD-10-CM | POA: Diagnosis present

## 2018-07-27 MED ORDER — TRAMADOL HCL 50 MG PO TABS: ORAL_TABLET | ORAL | 0 refills | Status: DC

## 2018-07-27 NOTE — Assessment & Plan Note (Signed)
Patient presents today complaining of low back pain after fall.  Patient has a history of lumbar pain with bilateral sciatica.  Pain is localized to his lumbar region.  Patient has failed NSAIDs.  Exam is limited due to pain but not concerning.  Pain likely secondary to trauma in the setting of chronic pain.  Will order lumbar x-ray.  Also prescribed 15 tabs of tramadol.  Recommended heat pad.  We will follow-up on imaging results.  Will not be prescribing tramadol going forward.  If patient needs more pain medication will refer to pain medication management clinic.  Patient has a history of requesting for specific narcotics to control his pain.

## 2018-07-27 NOTE — Progress Notes (Signed)
   Subjective:    Patient ID: Todd Miller, male    DOB: [birth year], 32 y.o.   MRN: [id number]   CC: Low back pain after fall  HPI: Patient is a 32 year old male with a past medical history significant for lumbar pain who presents today complaining of low back pain.  Patient reports that he was changing a light bulb at home when he fell off the chair and his lower back on the side table.  Prior to fall, patient reports that low back pain was improved.  Patient was seen by neurology and orthopedic surgery who recommended spinal injection for pain control.  Patient reports that he has taken Tylenol and ibuprofen for pain relief at any improvement in symptoms.  Patient reports he has been hard to sleep due to low back pain.  Denies any numbness, tingling down his legs, saddle anesthesia, bowel or urinary incontinence.   Smoking status reviewed   ROS: all other systems were reviewed and are negative other than in the HPI   No past medical history on file.  Past Surgical History:  Procedure Laterality Date  . HAND SURGERY      Past medical history, surgical, family, and social history reviewed and updated in the EMR as appropriate.  Objective:  BP 118/84   Pulse 64   Temp 98 F (36.7 C) (Oral)   Ht 5\' 6"  (1.676 m)   Wt 135 lb (61.2 kg)   SpO2 99%   BMI 21.79 kg/m   Vitals and nursing note reviewed  General: NAD, pleasant, able to participate in exam Cardiac: RRR, normal heart sounds, no murmurs. 2+ radial and PT pulses bilaterally Respiratory: CTAB, normal effort, No wheezes, rales or rhonchi Abdomen: soft, nontender, nondistended, no hepatic or splenomegaly, +BS Lumbar exam: No deformity, erythema, swelling.  Patient is tender to palpation in the lumbar region.  Limited range of motion due to pain.  Strength and sensation intact.  Neurovascularly intact. Extremities: no edema or cyanosis. WWP. Skin: warm and dry, no rashes noted Neuro: alert and oriented x4, no focal  deficits Psych: Normal affect and mood   Assessment & Plan:   Chronic bilateral low back pain with bilateral sciatica Patient presents today complaining of low back pain after fall.  Patient has a history of lumbar pain with bilateral sciatica.  Pain is localized to his lumbar region.  Patient has failed NSAIDs.  Exam is limited due to pain but not concerning.  Pain likely secondary to trauma in the setting of chronic pain.  Will order lumbar x-ray.  Also prescribed 15 tabs of tramadol.  Recommended heat pad.  We will follow-up on imaging results.  Will not be prescribing tramadol going forward.  If patient needs more pain medication will refer to pain medication management clinic.  Patient has a history of requesting for specific narcotics to control his pain.    Lovena NeighboursAbdoulaye Mani Celestin, MD [facility] PGY-3

## 2018-08-09 ENCOUNTER — Telehealth: Payer: Self-pay | Admitting: Family Medicine

## 2018-08-09 NOTE — Telephone Encounter (Signed)
RX Tramadol fill date 08/31/17 never picked up - [facility]

## 2018-09-08 ENCOUNTER — Ambulatory Visit: Payer: Medicaid Other | Admitting: Family Medicine

## 2018-09-10 ENCOUNTER — Ambulatory Visit: Payer: Medicaid Other | Admitting: Family Medicine

## 2018-09-29 ENCOUNTER — Ambulatory Visit: Payer: Medicaid Other | Attending: Family Medicine | Admitting: Physical Therapy

## 2018-09-29 ENCOUNTER — Other Ambulatory Visit: Payer: Self-pay

## 2018-09-29 DIAGNOSIS — M62830 Muscle spasm of back: Secondary | ICD-10-CM | POA: Diagnosis present

## 2018-09-29 DIAGNOSIS — G8929 Other chronic pain: Secondary | ICD-10-CM

## 2018-09-29 DIAGNOSIS — M6281 Muscle weakness (generalized): Secondary | ICD-10-CM | POA: Diagnosis present

## 2018-09-29 DIAGNOSIS — M5442 Lumbago with sciatica, left side: Secondary | ICD-10-CM | POA: Diagnosis present

## 2018-09-29 DIAGNOSIS — M5441 Lumbago with sciatica, right side: Secondary | ICD-10-CM | POA: Diagnosis present

## 2018-09-30 ENCOUNTER — Encounter: Payer: Self-pay | Admitting: Physical Therapy
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
## 2018-10-21 ENCOUNTER — Ambulatory Visit: Payer: Medicaid Other | Admitting: Physical Therapy

## 2018-10-26 ENCOUNTER — Encounter: Payer: Medicaid Other | Admitting: Physical Therapy

## 2019-01-10 ENCOUNTER — Ambulatory Visit: Payer: Medicaid Other

## 2019-01-11 ENCOUNTER — Encounter: Payer: Self-pay | Admitting: Physical Therapy

## 2019-01-13 ENCOUNTER — Encounter: Payer: Self-pay | Admitting: Physical Therapy

## 2019-01-13 ENCOUNTER — Ambulatory Visit: Payer: Medicaid Other | Attending: Family Medicine | Admitting: Physical Therapy

## 2019-01-13 DIAGNOSIS — M5442 Lumbago with sciatica, left side: Secondary | ICD-10-CM | POA: Insufficient documentation

## 2019-01-13 DIAGNOSIS — G8929 Other chronic pain: Secondary | ICD-10-CM | POA: Insufficient documentation

## 2019-01-13 DIAGNOSIS — M6281 Muscle weakness (generalized): Secondary | ICD-10-CM | POA: Insufficient documentation

## 2019-01-13 DIAGNOSIS — M62830 Muscle spasm of back: Secondary | ICD-10-CM | POA: Diagnosis present

## 2019-01-13 DIAGNOSIS — M5441 Lumbago with sciatica, right side: Secondary | ICD-10-CM | POA: Diagnosis present

## 2019-01-13 NOTE — Patient Instructions (Addendum)
Access Code: [email or web address]  URL: [email or web address]  Date: 01/13/2019  Prepared by: Ivery Quale   Exercises  Supine Piriformis Stretch - 3 reps - 1 sets - 30 hold - 2x daily - 6x weekly  Supine Hamstring Stretch with Strap - 3 reps - 1 sets - 30 hold - 2x daily - 6x weekly  Supine Lower Trunk Rotation - 2-3 reps - 1 sets - 10 hold - 2x daily - 6x weekly  Static Prone on Elbows - 3 sets - 60 sec hold - 2x daily - 6x weekly  Standing Lumbar Extension - 10 reps - 1-2 sets - 5 hold - 2x daily - 6x weekly  TENS UNIT: This is helpful for muscle pain and spasm.   Search and Purchase a TENS 7000 2nd edition at www.tenspros.com. It should be less than $30.     TENS unit instructions: Do not shower or bathe with the unit on Turn the unit off before removing electrodes or batteries If the electrodes lose stickiness add a drop of water to the electrodes after they are disconnected from the unit and place on plastic sheet. If you continued to have difficulty, call the TENS unit company to purchase more electrodes. Do not apply lotion on the skin area prior to use. Make sure the skin is clean and dry as this will help prolong the life of the electrodes. After use, always check skin for unusual red areas, rash or other skin difficulties. If there are any skin problems, does not apply electrodes to the same area. Never remove the electrodes from the unit by pulling the wires. Do not use the TENS unit or electrodes other than as directed. Do not change electrode placement without consultating your therapist or physician. Keep 2 fingers with between each electrode. Wear time ratio is 2:1, on to off times.    For example on for 30 minutes off for 15 minutes and then on for 30 minutes off for 15 minutes
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
## 2019-01-19 ENCOUNTER — Ambulatory Visit: Payer: Medicaid Other | Admitting: Physical Therapy

## 2019-01-21 ENCOUNTER — Encounter

## 2019-01-26 ENCOUNTER — Ambulatory Visit: Payer: Medicaid Other | Attending: Family Medicine | Admitting: Physical Therapy

## 2019-01-26 DIAGNOSIS — G8929 Other chronic pain: Secondary | ICD-10-CM | POA: Diagnosis present

## 2019-01-26 DIAGNOSIS — M62830 Muscle spasm of back: Secondary | ICD-10-CM | POA: Diagnosis present

## 2019-01-26 DIAGNOSIS — M5442 Lumbago with sciatica, left side: Secondary | ICD-10-CM | POA: Insufficient documentation

## 2019-01-26 DIAGNOSIS — M5441 Lumbago with sciatica, right side: Secondary | ICD-10-CM | POA: Diagnosis present

## 2019-01-26 DIAGNOSIS — M6281 Muscle weakness (generalized): Secondary | ICD-10-CM | POA: Insufficient documentation

## 2019-01-26 NOTE — Therapy (Addendum)
[place], [place], [address] Phone: [phone]   Fax:  [phone]  Physical Therapy Treatment/Discharge addendum  Patient Details  Name: Todd Miller MRN: [id number] Date of Birth: Oct 19, 1986 Referring Provider (PT): Simona Huh (called their office about new PT prescription)   Encounter Date: 01/26/2019  PT End of Session - 01/26/19 1801    Visit Number  2    Number of Visits  4    Date for PT Re-Evaluation  02/10/19    Authorization Type  MCD, 3 visits until 02/13/19    Authorization - Visit Number  1    Authorization - Number of Visits  3    PT Start Time  1725   pt late   PT Stop Time  1805    PT Time Calculation (min)  40 min    Activity Tolerance  Patient tolerated treatment well    Behavior During Therapy  [facility] for tasks assessed/performed       No past medical history on file.  Past Surgical History:  Procedure Laterality Date  . HAND SURGERY      There were no vitals filed for this visit.  Subjective Assessment - 01/26/19 1758    Subjective  PT relays back pain feels the same and he has been doing HEP, no interperter available today and used Status video interpreting.    Currently in Pain?  Yes    Pain Score  9     Pain Location  Back    Pain Orientation  Right;Left;Lower    Pain Descriptors / Indicators  Sharp;Aching    Pain Type  Chronic pain    Pain Radiating Towards  down bilat LE's    Pain Frequency  Constant                       [facility] Adult PT Treatment/Exercise - 01/26/19 0001      Exercises   Exercises  Lumbar      Lumbar Exercises: Stretches   Active Hamstring Stretch  Right;Left;2 reps;30 seconds    Single Knee to Chest Stretch  Right;Left;2 reps;20 seconds    Lower Trunk Rotation Limitations  5 sec  X10 reps bilat    Prone on Elbows Stretch  60 seconds    Prone on Elbows Stretch Limitations  increased pain, then switched to prone over  pillow under hips helped reduce pain back to baseline    Piriformis Stretch Bridge for strengthening  Right;Left;2 reps;20 seconds  10 reps small ROM due to pain     Modalities   Modalities  Electrical Stimulation;Moist Heat      Moist Heat Therapy   Number Minutes Moist Heat  15 Minutes    Moist Heat Location  Lumbar Spine      Electrical Stimulation   Electrical Stimulation Location  lumbar    Electrical Stimulation Action  IFC    Electrical Stimulation Parameters  tolerance, pt prone    Electrical Stimulation Goals  Tone;Pain               PT Short Term Goals - 01/13/19 2130      PT SHORT TERM GOAL #1   Title  Pt will be I in HEP     Baseline  HEP issued today    Time  3    Period  Weeks    Status  New      PT SHORT TERM GOAL #2   Title  He will increase lumbar flexion by 25%.    Baseline  50%    Time  3    Period  Weeks    Status  New      PT SHORT TERM GOAL #3   Title  Pt will report 3/10 pain after standing for 15 minutes     Baseline  9/10    Time  3    Period  Weeks    Status  New        PT Long Term Goals - 01/13/19 2130      PT LONG TERM GOAL #1   Title  He will increase lumbar flexion to [facility] in order to pick items off floor    Baseline  50% ROM    Time  4    Period  Weeks    Status  New      PT LONG TERM GOAL #2   Title  Pt will be able to stand for greater than 1 hour without radicular symptoms in bil LE in order to complete work tasks    Baseline  stand one hour but 9/10 pain    Time  4    Period  Weeks    Status  New      PT LONG TERM GOAL #3   Title  Pt will be independent with exercise program in order to allow pt to return to leisure activities such as soccer     Baseline  beginning HEP issued today    Time  4    Period  Weeks    Status  New      PT LONG TERM GOAL #4   Title  Be able to return to work with less than 2-3/10 pain    Baseline  has had to miss last 15 days due to pain    Time  4    Period  Weeks             Plan - 01/26/19 1807    Clinical Impression Statement  Session focused on lumbar/LE stretching today in order to reduce tightness and inc ROM in his back today. He was instructed to stay in pain free ROM as he had a lot of pain and guarding today. Session ended with MHP with TENS to reduce pain and tightness further, he did express some relief from this.     Rehab Potential  Fair    Clinical Impairments Affecting Rehab Potential  attendance and no shows from last episode    PT Frequency  1x / week    PT Duration  4 weeks    PT Treatment/Interventions  Cryotherapy;Electrical Stimulation;Iontophoresis 83m/ml Dexamethasone;Moist Heat;Traction;Ultrasound;Therapeutic exercise;Therapeutic activities;Neuromuscular re-education;Manual techniques;Passive range of motion;Dry needling;Taping;Spinal Manipulations    PT Next Visit Plan  stretching, leg stregnth and core, TENS and heat    PT Home Exercise Plan  [phone]   Consulted and Agree with Plan of Care  Patient       Patient will benefit from skilled therapeutic intervention in order to improve the following deficits and impairments:  Decreased activity tolerance, Decreased endurance, Decreased range of motion, Decreased strength, Hypomobility, Increased fascial restricitons, Increased muscle spasms, Pain  Visit Diagnosis: Chronic bilateral low back pain with bilateral sciatica  Muscle weakness (generalized)  Muscle spasm of back     Problem List Patient Active Problem List   Diagnosis Date Noted  . Tinea versicolor 09/28/2016  . Chronic bilateral low back pain with bilateral sciatica 07/17/2016  . Eye  pain 07/17/2016  . Poor appetite 04/30/2016  . Globus sensation 04/18/2016  . Anemia 02/19/2016  . Vitamin D deficiency 02/19/2016  . Urinary frequency 01/23/2016  . Anxiety state 01/23/2016  . Refugee health examination 01/23/2016  . Tobacco use disorder 01/23/2016    Silvestre Mesi 01/26/2019, 6:09  PM PHYSICAL THERAPY DISCHARGE SUMMARY  Visits from Start of Care: 2  Current functional level related to goals / functional outcomes: See above   Remaining deficits: See above   Education / Equipment: HEP Plan: Patient agrees to discharge.  Patient goals were not met. Patient is being discharged due to not returning since the last visit.  ?????    He only came to 2 visits then never returned Elsie Ra, PT, DPT 03/07/19 9:28 AM      [facility] [address] [place], [place], [address] Phone: [phone]   Fax:  [phone]  Name: Todd Miller MRN: [id number] Date of Birth: [birth year]

## 2019-02-02 ENCOUNTER — Ambulatory Visit: Payer: Medicaid Other | Admitting: Physical Therapy

## 2019-02-02 ENCOUNTER — Telehealth: Payer: Self-pay | Admitting: Physical Therapy

## 2019-02-02 NOTE — Telephone Encounter (Signed)
Pt no show for PT appointment today. They where contacted and informed of this by voicemail however pt speaks arabic so unclear if they will understand message.  Ivery Quale, PT, DPT 02/02/19 6:08 PM

## 2019-10-03 ENCOUNTER — Observation Stay (HOSPITAL_COMMUNITY)
Admission: EM | Admit: 2019-10-03 | Discharge: 2019-10-04 | Disposition: A | Discharge status: Home / Self Care | Payer: Medicaid Other | Attending: Family Medicine | Admitting: Family Medicine

## 2019-10-03 ENCOUNTER — Other Ambulatory Visit: Payer: Self-pay

## 2019-10-03 ENCOUNTER — Emergency Department (HOSPITAL_COMMUNITY): Payer: Medicaid Other

## 2019-10-03 ENCOUNTER — Emergency Department (HOSPITAL_BASED_OUTPATIENT_CLINIC_OR_DEPARTMENT_OTHER): Payer: Medicaid Other

## 2019-10-03 ENCOUNTER — Encounter (HOSPITAL_COMMUNITY): Payer: Self-pay | Admitting: Oncology

## 2019-10-03 DIAGNOSIS — D649 Anemia, unspecified: Secondary | ICD-10-CM | POA: Insufficient documentation

## 2019-10-03 DIAGNOSIS — M545 Low back pain: Secondary | ICD-10-CM | POA: Insufficient documentation

## 2019-10-03 DIAGNOSIS — R9431 Abnormal electrocardiogram [ECG] [EKG]: Secondary | ICD-10-CM | POA: Diagnosis not present

## 2019-10-03 DIAGNOSIS — F17290 Nicotine dependence, other tobacco product, uncomplicated: Secondary | ICD-10-CM | POA: Diagnosis not present

## 2019-10-03 DIAGNOSIS — E876 Hypokalemia: Secondary | ICD-10-CM | POA: Diagnosis not present

## 2019-10-03 DIAGNOSIS — Z20828 Contact with and (suspected) exposure to other viral communicable diseases: Secondary | ICD-10-CM | POA: Diagnosis not present

## 2019-10-03 DIAGNOSIS — R2689 Other abnormalities of gait and mobility: Secondary | ICD-10-CM | POA: Insufficient documentation

## 2019-10-03 DIAGNOSIS — G44209 Tension-type headache, unspecified, not intractable: Secondary | ICD-10-CM | POA: Insufficient documentation

## 2019-10-03 DIAGNOSIS — Z791 Long term (current) use of non-steroidal anti-inflammatories (NSAID): Secondary | ICD-10-CM | POA: Insufficient documentation

## 2019-10-03 DIAGNOSIS — Z79899 Other long term (current) drug therapy: Secondary | ICD-10-CM | POA: Insufficient documentation

## 2019-10-03 DIAGNOSIS — R079 Chest pain, unspecified: Principal | ICD-10-CM | POA: Insufficient documentation

## 2019-10-03 DIAGNOSIS — R778 Other specified abnormalities of plasma proteins: Secondary | ICD-10-CM

## 2019-10-03 DIAGNOSIS — E872 Acidosis: Secondary | ICD-10-CM | POA: Diagnosis not present

## 2019-10-03 DIAGNOSIS — Z789 Other specified health status: Secondary | ICD-10-CM | POA: Diagnosis not present

## 2019-10-03 DIAGNOSIS — G8929 Other chronic pain: Secondary | ICD-10-CM | POA: Insufficient documentation

## 2019-10-03 DIAGNOSIS — R55 Syncope and collapse: Secondary | ICD-10-CM | POA: Diagnosis not present

## 2019-10-03 DIAGNOSIS — E559 Vitamin D deficiency, unspecified: Secondary | ICD-10-CM | POA: Diagnosis not present

## 2019-10-03 DIAGNOSIS — I959 Hypotension, unspecified: Secondary | ICD-10-CM | POA: Insufficient documentation

## 2019-10-03 LAB — CBC WITH DIFFERENTIAL/PLATELET
Abs Immature Granulocytes: 0.12 10*3/uL — ABNORMAL HIGH (ref 0.00–0.07)
Basophils Absolute: 0 10*3/uL (ref 0.0–0.1)
Basophils Relative: 0 %
Eosinophils Absolute: 0 10*3/uL (ref 0.0–0.5)
Eosinophils Relative: 0 %
HCT: 46.1 % (ref 39.0–52.0)
Hemoglobin: 14.4 g/dL (ref 13.0–17.0)
Immature Granulocytes: 2 %
Lymphocytes Relative: 34 %
Lymphs Abs: 2.7 10*3/uL (ref 0.7–4.0)
MCH: 19.9 pg — ABNORMAL LOW (ref 26.0–34.0)
MCHC: 31.2 g/dL (ref 30.0–36.0)
MCV: 63.6 fL — ABNORMAL LOW (ref 80.0–100.0)
Monocytes Absolute: 0.2 10*3/uL (ref 0.1–1.0)
Monocytes Relative: 2 %
Neutro Abs: 4.9 10*3/uL (ref 1.7–7.7)
Neutrophils Relative %: 62 %
Platelets: 187 10*3/uL (ref 150–400)
RBC: 7.25 MIL/uL — ABNORMAL HIGH (ref 4.22–5.81)
RDW: 17.3 % — ABNORMAL HIGH (ref 11.5–15.5)
WBC: 7.9 10*3/uL (ref 4.0–10.5)
nRBC: 0 % (ref 0.0–0.2)

## 2019-10-03 LAB — COMPREHENSIVE METABOLIC PANEL
ALT: 28 U/L (ref 0–44)
AST: 22 U/L (ref 15–41)
Albumin: 4.1 g/dL (ref 3.5–5.0)
Alkaline Phosphatase: 44 U/L (ref 38–126)
Anion gap: 12 (ref 5–15)
BUN: 7 mg/dL (ref 6–20)
CO2: 21 mmol/L — ABNORMAL LOW (ref 22–32)
Calcium: 8.6 mg/dL — ABNORMAL LOW (ref 8.9–10.3)
Chloride: 109 mmol/L (ref 98–111)
Creatinine, Ser: 1.06 mg/dL (ref 0.61–1.24)
GFR calc Af Amer: >60 mL/min (ref >60)
GFR calc non Af Amer: >60 mL/min (ref >60)
Glucose, Bld: 177 mg/dL — ABNORMAL HIGH (ref 70–99)
Potassium: 3 mmol/L — ABNORMAL LOW (ref 3.5–5.1)
Sodium: 142 mmol/L (ref 135–145)
Total Bilirubin: 1.4 mg/dL — ABNORMAL HIGH (ref 0.3–1.2)
Total Protein: 6.9 g/dL (ref 6.5–8.1)

## 2019-10-03 LAB — CREATININE, SERUM
Creatinine, Ser: 0.86 mg/dL (ref 0.61–1.24)
GFR calc Af Amer: >60 mL/min (ref >60)
GFR calc non Af Amer: >60 mL/min (ref >60)

## 2019-10-03 LAB — URINALYSIS, ROUTINE W REFLEX MICROSCOPIC
Bacteria, UA: NONE SEEN
Bilirubin Urine: NEGATIVE
Glucose, UA: NEGATIVE mg/dL
Hgb urine dipstick: NEGATIVE
Ketones, ur: NEGATIVE mg/dL
Leukocytes,Ua: NEGATIVE
Nitrite: NEGATIVE
Protein, ur: 30 mg/dL — AB
Specific Gravity, Urine: 1.045 — ABNORMAL HIGH (ref 1.005–1.030)
pH: 6 (ref 5.0–8.0)

## 2019-10-03 LAB — PROTIME-INR
INR: 1.2 (ref 0.8–1.2)
Prothrombin Time: 15.2 seconds (ref 11.4–15.2)

## 2019-10-03 LAB — CBC
HCT: 36.3 % — ABNORMAL LOW (ref 39.0–52.0)
Hemoglobin: 11.2 g/dL — ABNORMAL LOW (ref 13.0–17.0)
MCH: 19.6 pg — ABNORMAL LOW (ref 26.0–34.0)
MCHC: 30.9 g/dL (ref 30.0–36.0)
MCV: 63.5 fL — ABNORMAL LOW (ref 80.0–100.0)
Platelets: 152 10*3/uL (ref 150–400)
RBC: 5.72 MIL/uL (ref 4.22–5.81)
RDW: 14.8 % (ref 11.5–15.5)
WBC: 15.4 10*3/uL — ABNORMAL HIGH (ref 4.0–10.5)
nRBC: 0 % (ref 0.0–0.2)

## 2019-10-03 LAB — RAPID URINE DRUG SCREEN, HOSP PERFORMED
Amphetamines: NOT DETECTED
Barbiturates: NOT DETECTED
Benzodiazepines: NOT DETECTED
Cocaine: NOT DETECTED
Opiates: NOT DETECTED
Tetrahydrocannabinol: NOT DETECTED

## 2019-10-03 LAB — SARS CORONAVIRUS 2 (TAT 6-24 HRS): SARS Coronavirus 2: NEGATIVE

## 2019-10-03 LAB — LACTIC ACID, PLASMA
Lactic Acid, Venous: 3.1 mmol/L (ref 0.5–1.9)
Lactic Acid, Venous: 2.6 mmol/L (ref 0.5–1.9)
Lactic Acid, Venous: 2.5 mmol/L (ref 0.5–1.9)

## 2019-10-03 LAB — TROPONIN I (HIGH SENSITIVITY)
Troponin I (High Sensitivity): 70 ng/L — ABNORMAL HIGH (ref <18)
Troponin I (High Sensitivity): 717 ng/L (ref <18)
Troponin I (High Sensitivity): 344 ng/L (ref <18)
Troponin I (High Sensitivity): 203 ng/L (ref <18)

## 2019-10-03 LAB — LACTATE DEHYDROGENASE: LDH: 106 U/L (ref 98–192)

## 2019-10-03 LAB — D-DIMER, QUANTITATIVE: D-Dimer, Quant: 0.64 ug/mL-FEU — ABNORMAL HIGH (ref 0.00–0.50)

## 2019-10-03 LAB — FERRITIN: Ferritin: 33 ng/mL (ref 24–336)

## 2019-10-03 LAB — HIV ANTIBODY (ROUTINE TESTING W REFLEX): HIV Screen 4th Generation wRfx: NONREACTIVE

## 2019-10-03 LAB — TRIGLYCERIDES: Triglycerides: 149 mg/dL (ref <150)

## 2019-10-03 LAB — FIBRINOGEN: Fibrinogen: 180 mg/dL — ABNORMAL LOW (ref 210–475)

## 2019-10-03 LAB — PROCALCITONIN: Procalcitonin: 4.22 ng/mL

## 2019-10-03 LAB — BRAIN NATRIURETIC PEPTIDE: B Natriuretic Peptide: 18 pg/mL (ref 0.0–100.0)

## 2019-10-03 LAB — CBG MONITORING, ED: Glucose-Capillary: 160 mg/dL — ABNORMAL HIGH (ref 70–99)

## 2019-10-03 LAB — APTT: aPTT: 42 seconds — ABNORMAL HIGH (ref 24–36)

## 2019-10-03 LAB — C-REACTIVE PROTEIN: CRP: <0.8 mg/dL (ref <1.0)

## 2019-10-03 LAB — MAGNESIUM: Magnesium: 1.6 mg/dL — ABNORMAL LOW (ref 1.7–2.4)

## 2019-10-03 MED ORDER — NITROGLYCERIN 0.4 MG SL SUBL
0.4000 mg | SUBLINGUAL_TABLET | SUBLINGUAL | Status: DC | PRN
  Administered 2019-10-03: 0.4 mg via SUBLINGUAL
  Filled 2019-10-03: qty 1

## 2019-10-03 MED ORDER — LACTATED RINGERS IV BOLUS
1000.0000 mL | Freq: Once | INTRAVENOUS | Status: AC
  Administered 2019-10-03: 1000 mL via INTRAVENOUS

## 2019-10-03 MED ORDER — SODIUM CHLORIDE 0.9 % IV SOLN
INTRAVENOUS | Status: AC
  Administered 2019-10-03: 100 mL/h via INTRAVENOUS

## 2019-10-03 MED ORDER — IOHEXOL 350 MG/ML SOLN
100.0000 mL | Freq: Once | INTRAVENOUS | Status: AC | PRN
  Administered 2019-10-03: 100 mL via INTRAVENOUS

## 2019-10-03 MED ORDER — LIDOCAINE VISCOUS HCL 2 % MT SOLN
15.0000 mL | Freq: Once | OROMUCOSAL | Status: AC
  Administered 2019-10-03: 15 mL via ORAL
  Filled 2019-10-03: qty 15

## 2019-10-03 MED ORDER — POLYETHYLENE GLYCOL 3350 17 G PO PACK: 17.0000 g | PACK | Freq: Every day | ORAL | Status: DC | PRN

## 2019-10-03 MED ORDER — HYDROCODONE-ACETAMINOPHEN 5-325 MG PO TABS
1.0000 | ORAL_TABLET | Freq: Four times a day (QID) | ORAL | Status: DC | PRN
  Administered 2019-10-03: 1 via ORAL
  Filled 2019-10-03: qty 1

## 2019-10-03 MED ORDER — ALUM & MAG HYDROXIDE-SIMETH 200-200-20 MG/5ML PO SUSP
30.0000 mL | Freq: Once | ORAL | Status: AC
  Administered 2019-10-03: 30 mL via ORAL
  Filled 2019-10-03: qty 30

## 2019-10-03 MED ORDER — ENOXAPARIN SODIUM 40 MG/0.4ML ~~LOC~~ SOLN
40.0000 mg | SUBCUTANEOUS | Status: DC
  Filled 2019-10-03 (×3): qty 0.4

## 2019-10-03 MED ORDER — POTASSIUM CHLORIDE CRYS ER 20 MEQ PO TBCR
80.0000 meq | EXTENDED_RELEASE_TABLET | Freq: Once | ORAL | Status: AC
  Administered 2019-10-03: 80 meq via ORAL
  Filled 2019-10-03: qty 4

## 2019-10-03 MED ORDER — ASPIRIN 81 MG PO CHEW
324.0000 mg | CHEWABLE_TABLET | Freq: Once | ORAL | Status: AC
  Administered 2019-10-03: 324 mg via ORAL
  Filled 2019-10-03: qty 4

## 2019-10-03 MED ORDER — ACETAMINOPHEN 325 MG PO TABS
650.0000 mg | ORAL_TABLET | Freq: Once | ORAL | Status: AC
  Administered 2019-10-03: 650 mg via ORAL
  Filled 2019-10-03: qty 2
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
## 2019-10-03 NOTE — ED Notes (Addendum)
Pt states  he has chest pain. Does not quantify but stes very bad.
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
## 2019-10-03 NOTE — Progress Notes (Signed)
  Echocardiogram 2D Echocardiogram has been performed.  Todd Miller 10/03/2019, 11:36 AM

## 2019-10-03 NOTE — ED Triage Notes (Signed)
Pt bib [facility] from home.  Initial call out was for CP.  Pt was hypotensive 74/48, diaphoretic and unresponsive.  Liter bolus of NS given w/ improvement in BP.    Pt reports shob.

## 2019-10-03 NOTE — ED Notes (Addendum)
Dr. Grandville Silos contacted to inform pt pain continues and previous bp to nitro. States may give vicodin.

## 2019-10-03 NOTE — Consult Note (Addendum)
Cardiology Consultation:   Patient ID: [facility]; [id number]; [birth year]   Admit date: 10/03/2019 Date of Consult: 10/03/2019  Primary Care Provider: Mirian MoFrank, Peter, MD Primary Cardiologist: n/a Primary Electrophysiologist:  n/a   Patient Profile:   Todd Miller is a 33 y.o. male with w/ hx of herniated disc on chronic opioid meds who is being seen today for the evaluation of elevated troponin at the request of Dr.Pfeiffer.  History of Present Illness:   Todd Miller was examined and evaluated at bedside this AM in the ED. He states he was in his usual state of health until yesterday when shortly after having a very hot bad, he felt dyspneic with leg weakness and EMS was called. EMS states they found him to be hypoxic and hypotensive with improved quickly with fluid resuscitation and supplemental oxygen. [facility] and found to elevated troponins without ST changes and cardiology was consulted. He mentions that since arrival, his symptoms have resolved. He is experiencing mild chest pain at the moment but states it feels like 'burning sensation' similar to reflux and denies any radiation or exacerbation with exertion.  He mentions that 6 months prior he had a similar episode when [place] and he was told he had an 'infection around his heart' but he left AMA as he wanted to go home. He had no further follow up.   History reviewed. No pertinent past medical history.  Past Surgical History:  Procedure Laterality Date  . HAND SURGERY       Home Medications:  Prior to Admission medications   Medication Sig Start Date End Date Taking? Authorizing Provider  acetaminophen (TYLENOL) 500 MG tablet Take 500 mg by mouth every 6 (six) hours as needed for moderate pain.    [provider]  acetaminophen-codeine (TYLENOL #3) 300-30 MG tablet Take 1-2 tablets by mouth every 8 (eight) hours as needed for moderate pain. 02/17/18    Kathryne HitchBlackman, Christopher Y, MD  amitriptyline (ELAVIL) 25 MG tablet Take 1 tablet (25 mg total) by mouth at bedtime. 12/02/17   Wendee BeaversMcMullen, David J, DO  cyclobenzaprine (FLEXERIL) 10 MG tablet Take 1 tablet (10 mg total) by mouth 3 (three) times daily as needed for muscle spasms. 09/23/17   Beaulah DinningGambino, Christina M, MD  gabapentin (NEURONTIN) 100 MG capsule Take 1 capsule (100 mg total) by mouth 3 (three) times daily. 02/17/18   Kathryne HitchBlackman, Christopher Y, MD  HYDROcodone-acetaminophen (NORCO/VICODIN) 5-325 MG tablet Take 1 tablet by mouth every 6 (six) hours as needed for moderate pain. 01/27/18   Kathryne HitchBlackman, Christopher Y, MD  ketoconazole (NIZORAL) 2 % shampoo Apply 1 application topically 2 (two) times a week. Daily for two weeks 07/27/17   [name], Kanishka G, MD  meloxicam (MOBIC) 15 MG tablet Take 1 tablet (15 mg total) by mouth daily as needed for pain. 09/02/17   Cammy Copaean, Gregory Scott, MD  methylPREDNISolone (MEDROL) 4 MG tablet Medrol dose pack. Take as instructed 01/27/18   Kathryne HitchBlackman, Christopher Y, MD  naproxen (NAPROSYN) 500 MG tablet Take 1 tablet (500 mg total) by mouth 2 (two) times daily as needed. 02/17/18   Kathryne HitchBlackman, Christopher Y, MD  tiZANidine (ZANAFLEX) 4 MG tablet Take 1 tablet (4 mg total) by mouth every 8 (eight) hours as needed for muscle spasms. 02/17/18   Kathryne HitchBlackman, Christopher Y, MD  traMADol (ULTRAM) 50 MG tablet Take 1 tablet (50 mg total) by mouth every 8 (eight) hours as needed. 01/14/18   Diallo,  [name], MD  traMADol (ULTRAM) 50 MG tablet TAKE 1 TABLET BY MOUTH EVERY 8 TO 12 HOURS AS NEEDED FOR PAIN 07/27/18   Diallo, Lilia Argue, MD  Trolamine Salicylate (ASPERCREME) 10 % LOTN Apply 1 application topically 4 (four) times daily as needed. 09/23/17   Beaulah Dinning, MD    Inpatient Medications: Scheduled Meds:  Continuous Infusions:  PRN Meds:   Allergies:   No Known Allergies  Social History:   Social History   Socioeconomic History  . Marital status: Married    Spouse name:  Not on file  . Number of children: Not on file  . Years of education: Not on file  . Highest education level: Not on file  Occupational History  . Not on file  Social Needs  . Financial resource strain: Not on file  . Food insecurity    Worry: Not on file    Inability: Not on file  . Transportation needs    Medical: Not on file    Non-medical: Not on file  Tobacco Use  . Smoking status: Current Some Day Smoker    Types: Pipe  . Smokeless tobacco: Never Used  Substance and Sexual Activity  . Alcohol Use    Frequency: Never  . Drug use: Not on file  . Sexual activity: Not on file  Lifestyle  . Physical activity    Days per week: Not on file    Minutes per session: Not on file  . Stress: Not on file  Relationships  . Social Musician on phone: Not on file    Gets together: Not on file    Attends religious service: Not on file    Active member of club or organization: Not on file    Attends meetings of clubs or organizations: Not on file    Relationship status: Not on file  . Intimate partner violence    Fear of current or ex partner: Not on file    Emotionally abused: Not on file    Physically abused: Not on file    Forced sexual activity: Not on file  Other Topics Concern  . Not on file  Social History Narrative  . Not on file    Family History:   Denies any cardiac hx in relatives Family History  Problem Relation Age of Onset  . Healthy Mother   . Healthy Father      ROS:  Please see the history of present illness.  Review of Systems  Constitution: Negative for chills, fever and malaise/fatigue.  Cardiovascular: Negative for chest pain and dyspnea on exertion.  Respiratory: Negative for cough, shortness of breath and wheezing.   Gastrointestinal: Positive for heartburn. Negative for abdominal pain, constipation and diarrhea.  All other systems reviewed and are negative.    Physical Exam/Data:   Vitals:   10/03/19 0415 10/03/19 0430 10/03/19  0445 10/03/19 0644  BP: 109/77 110/78 103/77 102/78  Pulse: 87 82 73 76  Resp: (!) 23 20 (!) 22 18  Temp:      TempSrc:      SpO2: 98% 98% 99% 100%  Weight:      Height:        Intake/Output Summary (Last 24 hours) at 10/03/2019 0836 Last data filed at 10/03/2019 0313 Gross per 24 hour  Intake 1000 ml  Output -  Net 1000 ml   Filed Weights   10/03/19 0143  Weight: 74.8 kg   Body mass index is 23.01 kg/m.  Gen: Well-developed, well nourished, NAD HEENT: NCAT head, hearing intact, EOMI, MMM Neck: supple, ROM intact, no JVD, no cervical adenopathy, no carotid bruit CV: RRR, S1, S2 normal, No rubs, no murmurs, no gallops, no chest wall tenderness Pulm: CTAB, No rales, no wheezes, no dullness to percussion  Abd: Soft, BS+, NTND, No rebound, no guarding Extm: ROM intact, Peripheral pulses intact, No peripheral edema Skin: Dry, Warm, normal turgor  EKG:  The EKG was personally reviewed and demonstrates:  Normal sinus, normal axis, no EKG changes Telemetry:  Telemetry was personally reviewed and demonstrates:  Normal sinus  Relevant CV Studies: IMPRESSION: 1. Negative for pulmonary embolism or other acute finding. 2. Splenomegaly which was also seen in 2017.   Laboratory Data:  Chemistry Recent Labs  Lab 10/03/19 0154  NA 142  K 3.0*  CL 109  CO2 21*  GLUCOSE 177*  BUN 7  CREATININE 1.06  CALCIUM 8.6*  GFRNONAA >60  GFRAA >60  ANIONGAP 12    Recent Labs  Lab 10/03/19 0154  PROT 6.9  ALBUMIN 4.1  AST 22  ALT 28  ALKPHOS 44  BILITOT 1.4*   Hematology Recent Labs  Lab 10/03/19 0154  WBC 7.9  RBC 7.25*  HGB 14.4  HCT 46.1  MCV 63.6*  MCH 19.9*  MCHC 31.2  RDW 17.3*  PLT 187   Cardiac EnzymesNo results for input(s): TROPONINI in the last 168 hours. No results for input(s): TROPIPOC in the last 168 hours.  BNPNo results for input(s): BNP, PROBNP in the last 168 hours.  DDimer  Recent Labs  Lab 10/03/19 0154  DDIMER 0.64*     Radiology/Studies:  Ct Angio Chest Pe W And/or Wo Contrast  Result Date: 10/03/2019 CLINICAL DATA:  Positive D-dimer with pulmonary embolism suspected. Chest pain and diaphoresis. Unresponsive EXAM: CT ANGIOGRAPHY CHEST WITH CONTRAST TECHNIQUE: Multidetector CT imaging of the chest was performed using the standard protocol during bolus administration of intravenous contrast. Multiplanar CT image reconstructions and MIPs were obtained to evaluate the vascular anatomy. CONTRAST:  OMNIPAQUE IOHEXOL 350 MG/ML SOLN COMPARISON:  None. FINDINGS: Cardiovascular: Satisfactory opacification of the pulmonary arteries to the segmental level. No evidence of pulmonary embolism. Normal heart size. No pericardial effusion. Mediastinum/Nodes: Negative for adenopathy or mass Lungs/Pleura: There is no edema, consolidation, effusion, or pneumothorax. Upper Abdomen: Partial coverage of the spleen shows enlargement with a 13.4 cm maximal span on axial slices. This is stable from 2017. Musculoskeletal: Negative Review of the MIP images confirms the above findings. IMPRESSION: 1. Negative for pulmonary embolism or other acute finding. 2. Splenomegaly which was also seen in 2017. Electronically Signed   By: Marnee Spring M.D.   On: 10/03/2019 05:22   Dg Chest Port 1 View  Result Date: 10/03/2019 CLINICAL DATA:  33 year old male with shortness of breath. EXAM: PORTABLE CHEST 1 VIEW COMPARISON:  CT Chest, Abdomen, and Pelvis 05/27/2016. Chest radiographs 10/28/2015. FINDINGS: Portable AP semi upright view at 0218 hours. Lung volumes and mediastinal contours are within normal limits. Allowing for portable technique the lungs are clear. No pneumothorax. Visualized tracheal air column is within normal limits. Negative visible bowel gas pattern. No osseous abnormality identified. IMPRESSION: Negative portable chest. Electronically Signed   By: Odessa Fleming M.D.   On: 10/03/2019 02:31    Assessment and Plan:   Elevated Troponin  Near Syncope HsTrop rising 70 ->717. EKG w/o ST changes. Currently endorsing mild chest pain. Questionable recent hx of untreated pericarditis. CT chest w/o PE or pericardial  effusion. Echo pending. Hx sounds more likely to be orthostatic syncope after hot bath and possible demand ischemia as possible etiology. Differential includes viral myocarditis. Heart score of 3 for major cardiac events. - Trend trop - F/u Echo - F/u COVID-19 - F/u UDS  For questions or updates, please contact [place] Please consult [email or web address] for contact info under Cardiology/STEMI.   Signed, Gilberto Better, MD PGY-2, [place] [facility] Pager: [phone] 10/03/2019 8:36 AM  Attending attestation to follow  I have seen and examined the patient along with Gilberto Better, MD.  I have reviewed the chart, notes and new data.  I agree with PA/NP's note.  Key new complaints: presentation is confusing, but hypotension and probably syncope seem to definitely be part of it. Circumstances consistent with a vasovagal event. Transient (reported) hypoxia resolved spontaneously. Subjective "fever" was not present when checked.  Key examination changes: normal CV exam Key new findings / data: normal ECG, small increase in hsTnI. No pericardial abnormalities and no coronary calcification on chest CT.  I have reviewed the echo. It is completely normal: normal systolic and diastolic function, normal wall motion and no pericardial effusion. Fibrinogen and CRP are normal - no sign of a serious inflammatory condition.  PLAN: Mild increase in troponin is consistent with mild demand injury in the setting of hypotension. There is no other evidence to support either CAD or myocarditis.  Do not plan additional cardiac workup at this time.  [facility] will sign off.   Medication Recommendations:  n/a Other recommendations (labs, testing, etc):  Avoid dehydration, excessive heat exposure, stay well hydrated, liberal salt diet.  Follow up as an outpatient:  prn   Sanda Klein, MD, [facility] [phone] 10/03/2019, 12:40 PM

## 2019-10-03 NOTE — ED Notes (Signed)
Through interpretor. Pt states his pain continues at 8/10 but had some reoief after nitro and liquid meds. Pt A&O x 3 maew.

## 2019-10-03 NOTE — ED Provider Notes (Signed)
Emergency Department Provider Note   I have reviewed the triage vital signs and the nursing notes.   HISTORY  Chief Complaint Chest Pain   HPI Todd Miller is a 33 y.o. male who presents to the emergency department today for what sounds like shortness of breath may be chest pain.  EMS states that originally called out for chest pain but when they got there the patient was short of breath.  They stated that he is initially very groggy and sleepy had a low blood pressure (70/40).  He had a bunch of family members around that were being very anxious and hysterical so that is staying him up to get him out of the house when they did that he syncopized.  He was subsequently brought here for further evaluation.  They stated that his blood pressure improved with some fluids.  This that he was requiring oxygen to keep his sats at 92.  Speaks Arabic so it is unknown clear about any further history from EMS. With interpreter the patient states that right now he feels much much better almost okay.  He states initially he had chest pain and was a bit short of breath.  Was very hot and cold intermittently.  No nausea or vomiting present decreased appetite for the last week but for unclear reasons.  States he has had decreased in eating and drinking during that time.  Patient states no cough.  No recent illnesses.  No sick contacts.  He also says no headache, neck pain, chest pain, back pain, abdominal pain that is new at this time.  When notes that his eyes are red I asked him how long it has been going on he said it started when he went to the bathroom earlier today when he first started feeling bad.  Denies drugs, alcohol, or medication use but does endorse using hookah.   No other associated or modifying symptoms.    History reviewed. No pertinent past medical history.  Patient Active Problem List   Diagnosis Date Noted  . Tinea versicolor 09/28/2016  . 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 Eye pain 07/17/2016  . Poor appetite 04/30/2016  . Globus sensation 04/18/2016  . Anemia 02/19/2016  . Vitamin D deficiency 02/19/2016  . Urinary frequency 01/23/2016  . Anxiety state 01/23/2016  . Refugee health examination 01/23/2016  . Tobacco use disorder 01/23/2016    Past Surgical History:  Procedure Laterality Date  . HAND SURGERY      Current Outpatient Rx  . Order #: [id number] Class: Historical Med  . Order #: [id number] Class: Normal  . Order #: [id number] Class: Normal  . Order #: [id number] Class: Normal  . Order #: [id number] Class: Normal  . Order #: [id number] Class: Print  . Order #: [id number] Class: Normal  . Order #: [id number] Class: Print  . Order #: [id number] Class: Normal  . Order #: [id number] Class: Normal  . Order #: [id number] Class: Normal  . Order #: [id number] Class: Print  . Order #: [id number] Class: Normal  . Order #: [id number] Class: Normal    Allergies Patient has no known allergies.  Family History  Problem Relation Age of Onset  . Healthy Mother   . Healthy Father     Social History Social History   Tobacco Use  . Smoking status: Current Some Day Smoker    Types: Pipe  . Smokeless tobacco: Never Used  Substance Use Topics  . Alcohol Use    Frequency: Never  . Drug use: Not on file  Review of Systems  All other systems negative except as documented in the HPI. All pertinent positives and negatives as reviewed in the HPI. ____________________________________________   PHYSICAL EXAM:  VITAL SIGNS: ED Triage Vitals  Enc Vitals Group     BP 10/03/19 0140 112/68     Pulse Rate 10/03/19 0140 79     Resp 10/03/19 0140 (!) 23     Temp 10/03/19 0200 (!) 97.5 F (36.4 C)     Temp Source 10/03/19 0200 Oral     SpO2 10/03/19 0136 92 %     Weight 10/03/19 0143 165 lb (74.8 kg)     Height 10/03/19 0143 5\' 11"  (1.803 m)    Constitutional: Alert and oriented. Well appearing and in no acute distress. Eyes:  Conjunctivae are significantly injected. PERRL. EOMI. Head: Atraumatic. Nose: No congestion/rhinnorhea. Mouth/Throat: Mucous membranes are moist.  Oropharynx non-erythematous. Neck: No stridor.  No meningeal signs.   Cardiovascular: Normal rate, regular rhythm. Good peripheral circulation. Grossly normal heart sounds.   Respiratory: Normal respiratory effort.  No retractions. Lungs CTAB. Gastrointestinal: Soft and nontender. No distention.  Musculoskeletal: No lower extremity tenderness nor edema. No gross deformities of extremities. Neurologic:  Normal speech and language. No gross focal neurologic deficits are appreciated.  Skin:  Skin is warm, dry and intact. No rash noted.  ____________________________________________   LABS (all labs ordered are listed, but only abnormal results are displayed)  Labs Reviewed  LACTIC ACID, PLASMA - Abnormal; Notable for the following components:      Result Value   Lactic Acid, Venous 3.1 (*)    All other components within normal limits  COMPREHENSIVE METABOLIC PANEL - Abnormal; Notable for the following components:   Potassium 3.0 (*)    CO2 21 (*)    Glucose, Bld 177 (*)    Calcium 8.6 (*)    Total Bilirubin 1.4 (*)    All other components within normal limits  CBC WITH DIFFERENTIAL/PLATELET - Abnormal; Notable for the following components:   RBC 7.25 (*)    MCV 63.6 (*)    MCH 19.9 (*)    RDW 17.3 (*)    Abs Immature Granulocytes 0.12 (*)    All other components within normal limits  APTT - Abnormal; Notable for the following components:   aPTT 42 (*)    All other components within normal limits  URINALYSIS, ROUTINE W REFLEX MICROSCOPIC - Abnormal; Notable for the following components:   APPearance HAZY (*)    Specific Gravity, Urine 1.045 (*)    Protein, ur 30 (*)    All other components within normal limits  D-DIMER, QUANTITATIVE (NOT AT [facility]) - Abnormal; Notable for the following components:   D-Dimer, Quant 0.64 (*)    All  other components within normal limits  MAGNESIUM - Abnormal; Notable for the following components:   Magnesium 1.6 (*)    All other components within normal limits  LACTIC ACID, PLASMA - Abnormal; Notable for the following components:   Lactic Acid, Venous 2.6 (*)    All other components within normal limits  CBG MONITORING, ED - Abnormal; Notable for the following components:   Glucose-Capillary 160 (*)    All other components within normal limits  TROPONIN I (HIGH SENSITIVITY) - Abnormal; Notable for the following components:   Troponin I (High Sensitivity) 70 (*)    All other components within normal limits  TROPONIN I (HIGH SENSITIVITY) - Abnormal; Notable for the following components:   Troponin I (High Sensitivity) 717 (*)  All other components within normal limits  CULTURE, BLOOD (ROUTINE X 2)  CULTURE, BLOOD (ROUTINE X 2)  URINE CULTURE  SARS CORONAVIRUS 2 (TAT 6-24 HRS)  PROTIME-INR  LACTIC ACID, PLASMA   ____________________________________________  EKG   EKG Interpretation  Date/Time:  Monday October 03 2019 01:45:28 EDT Ventricular Rate:  79 PR Interval:    QRS Duration: 93 QT Interval:  413 QTC Calculation: 474 R Axis:   83 Text Interpretation:  Sinus rhythm Borderline short PR interval Consider right atrial enlargement Minimal ST depression, diffuse leads Borderline prolonged QT interval No old tracing to compare Confirmed by Marily MemosMesner, Tristin Gladman [phone]([id number]) on 10/03/2019 2:11:51 AM       EKG Interpretation  Date/Time:  Monday October 03 2019 07:16:58 EDT Ventricular Rate:  77 PR Interval:    QRS Duration: 88 QT Interval:  401 QTC Calculation: 454 R Axis:   75 Text Interpretation:  Sinus rhythm Borderline short PR interval improved ST depression in inferior and septal lead compared to earlier in day Confirmed by Marily MemosMesner, Tascha Casares [phone]([id number]) on 10/03/2019 7:24:00 AM       ____________________________________________  RADIOLOGY  Ct Angio Chest Pe W And/or Wo  Contrast  Result Date: 10/03/2019 CLINICAL DATA:  Positive D-dimer with pulmonary embolism suspected. Chest pain and diaphoresis. Unresponsive EXAM: CT ANGIOGRAPHY CHEST WITH CONTRAST TECHNIQUE: Multidetector CT imaging of the chest was performed using the standard protocol during bolus administration of intravenous contrast. Multiplanar CT image reconstructions and MIPs were obtained to evaluate the vascular anatomy. CONTRAST:  100mL OMNIPAQUE IOHEXOL 350 MG/ML SOLN COMPARISON:  None. FINDINGS: Cardiovascular: Satisfactory opacification of the pulmonary arteries to the segmental level. No evidence of pulmonary embolism. Normal heart size. No pericardial effusion. Mediastinum/Nodes: Negative for adenopathy or mass Lungs/Pleura: There is no edema, consolidation, effusion, or pneumothorax. Upper Abdomen: Partial coverage of the spleen shows enlargement with a 13.4 cm maximal span on axial slices. This is stable from 2017. Musculoskeletal: Negative Review of the MIP images confirms the above findings. IMPRESSION: 1. Negative for pulmonary embolism or other acute finding. 2. Splenomegaly which was also seen in 2017. Electronically Signed   By: Marnee SpringJonathon  Watts M.D.   On: 10/03/2019 05:22   Dg Chest Port 1 View  Result Date: 10/03/2019 CLINICAL DATA:  33 year old male with shortness of breath. EXAM: PORTABLE CHEST 1 VIEW COMPARISON:  CT Chest, Abdomen, and Pelvis 05/27/2016. Chest radiographs 10/28/2015. FINDINGS: Portable AP semi upright view at 0218 hours. Lung volumes and mediastinal contours are within normal limits. Allowing for portable technique the lungs are clear. No pneumothorax. Visualized tracheal air column is within normal limits. Negative visible bowel gas pattern. No osseous abnormality identified. IMPRESSION: Negative portable chest. Electronically Signed   By: Odessa FlemingH  Hall M.D.   On: 10/03/2019 02:31    ____________________________________________   PROCEDURES  Procedure(s) performed:    .Critical Care Performed by: Marily MemosMesner, Giovanna Kemmerer, MD Authorized by: Marily MemosMesner, Deshon Koslowski, MD   Critical care provider statement:    Critical care time (minutes):  45   Critical care was necessary to treat or prevent imminent or life-threatening deterioration of the following conditions:  Cardiac failure   Critical care was time spent personally by me on the following activities:  Discussions with consultants, evaluation of patient's response to treatment, examination of patient, ordering and performing treatments and interventions, ordering and review of laboratory studies, ordering and review of radiographic studies, pulse oximetry, re-evaluation of patient's condition, obtaining history from patient or surrogate and review of old charts  ____________________________________________   INITIAL IMPRESSION / ASSESSMENT AND PLAN / ED COURSE  PE vs myocarditis vs viral illness? Odd presentation in itself. Asymptomatic now making PE and myocarditis less likely.   Positive LA and troponin, likely from transient hypotension. Still symptom free and asking to go home. Will check second to ensure not rising. If so, will admit and if improving or stable will likely discharge.   Second troponin is 717, cp free. Negative PE scan. improving lactic. Myocarditis? Will d/w cardiology for admission. Still pain free. Looks great. Repeat ecg improved. Asa started. Will hold on heparin until cardiology discussion.   Care transferred pending consultations and admission.   Pertinent labs & imaging results that were available during my care of the patient were reviewed by me and considered in my medical decision making (see chart for details).  ____________________________________________  FINAL CLINICAL IMPRESSION(S) / ED DIAGNOSES  Final diagnoses:  None     MEDICATIONS GIVEN DURING THIS VISIT:  Medications  aspirin chewable tablet 324 mg (has no administration in time range)  lactated ringers bolus 1,000  mL (0 mLs Intravenous Stopped 10/03/19 0313)  potassium chloride SA (KLOR-CON) CR tablet 80 mEq (80 mEq Oral Given 10/03/19 0603)  iohexol (OMNIPAQUE) 350 MG/ML injection 100 mL (100 mLs Intravenous Contrast Given 10/03/19 0453)     NEW OUTPATIENT MEDICATIONS STARTED DURING THIS VISIT:  New Prescriptions   No medications on file    Note:  This note was prepared with assistance of Dragon voice recognition software. Occasional wrong-word or sound-a-like substitutions may have occurred due to the inherent limitations of voice recognition software.   Merrily Pew, MD 10/06/19 [phone]

## 2019-10-04 LAB — BASIC METABOLIC PANEL
Anion gap: 11 (ref 5–15)
BUN: 5 mg/dL — ABNORMAL LOW (ref 6–20)
CO2: 23 mmol/L (ref 22–32)
Calcium: 8.8 mg/dL — ABNORMAL LOW (ref 8.9–10.3)
Chloride: 106 mmol/L (ref 98–111)
Creatinine, Ser: 0.76 mg/dL (ref 0.61–1.24)
GFR calc Af Amer: >60 mL/min (ref >60)
GFR calc non Af Amer: >60 mL/min (ref >60)
Glucose, Bld: 106 mg/dL — ABNORMAL HIGH (ref 70–99)
Potassium: 4 mmol/L (ref 3.5–5.1)
Sodium: 140 mmol/L (ref 135–145)

## 2019-10-04 LAB — URINE CULTURE: Culture: NO GROWTH

## 2019-10-04 LAB — CBC
HCT: 38 % — ABNORMAL LOW (ref 39.0–52.0)
Hemoglobin: 11.7 g/dL — ABNORMAL LOW (ref 13.0–17.0)
MCH: 19.3 pg — ABNORMAL LOW (ref 26.0–34.0)
MCHC: 30.8 g/dL (ref 30.0–36.0)
MCV: 62.6 fL — ABNORMAL LOW (ref 80.0–100.0)
Platelets: 152 10*3/uL (ref 150–400)
RBC: 6.07 MIL/uL — ABNORMAL HIGH (ref 4.22–5.81)
RDW: 15 % (ref 11.5–15.5)
WBC: 9.8 10*3/uL (ref 4.0–10.5)
nRBC: 0 % (ref 0.0–0.2)

## 2019-10-04 LAB — TSH: TSH: 0.836 u[IU]/mL (ref 0.350–4.500)

## 2019-10-04 MED ORDER — MAGNESIUM OXIDE 400 (241.3 MG) MG PO TABS
400.0000 mg | ORAL_TABLET | Freq: Once | ORAL | Status: AC
  Administered 2019-10-04: 400 mg via ORAL
  Filled 2019-10-04: qty 1

## 2019-10-04 NOTE — Progress Notes (Signed)
Pt stated chest pain had decreased from 6/10 to 2/10 after evening GI cocktail given. Will continue to monitor.

## 2019-10-04 NOTE — Progress Notes (Signed)
Family Medicine Teaching Service Daily Progress Note Intern Pager: [phone]  Patient name: Todd Miller [facility] Medical record number: [id number] Date of birth: [birth year] Age: 33 y.o. Gender: male  Primary Care Provider: Matilde Haymaker, MD Consultants: Cardiology Code Status: Full code  Pt Overview and Major Events to Date:  10/03/19- admitted for CP, treated with GI cocktail, Nitro, cards consult  10/13-   Assessment and Plan: Todd Miller is a 33 y.o. male presenting with chest pain and dyspnea, admitted for ACS rule out. PMH is significant for chronic lower back pain, tobacco abuse.   ACS rule out, chest pain with dyspnea Patient reports no longer in chest pain Echocardiogram shows LVEF 60-65%, no LVH, trace MV regurgitation, normal aortic valve, normal pulmonic valve, normal IVC.  Troponins down trended from 717 > 344 > 203 Cardiology recommends patient to stay well-hydrated and avoid excessive heat exposure.  Cardiology believes there is no evidence to support either CAD or myocarditis and that this troponin increase is likely due to demand injury in the setting of hypotension. Negative UDS. No evidence of orthostatic changes.  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  White blood cell count 9.8 from 15.4 on admission. COVID test negative. Blood cultures negative at this time.  Chronic lower back pain Patient noted to have bulging of L3-L4, L4-L5, patient takes hydrocodone 10-325 mg every 6 hours as home medication. -Continue home hydrocodone  Tobacco abuse Patient regularly smokes hookah -Patient to have smoking cessation education  Hypokalemia Potassium is 3.0 on admission, potassium was repleted in ED.  Potassium corrected to 4.0. -We will continue to monitor on BMP  Tension  headache -Tylenol PRN   FEN/GI: Regular diet PPx: Lovenox  Disposition: Patient likely to discharge home today  Subjective:  Patient reports no further episode of chest pain or SOB. Patient states that he would like to go home   Objective: Temp:  [97.4 F (36.3 C)-98.2 F (36.8 C)] 97.4 F (36.3 C) (10/13 0753) Pulse Rate:  [61-87] 61 (10/13 0753) Resp:  [14-25] 16 (10/13 0753) BP: (87-129)/(50-79) 123/76 (10/13 0753) SpO2:  [91 %-100 %] 99 % (10/13 0753) Weight:  [69.2 kg] 69.2 kg (10/13 0530)  Physical Exam: General: male appearing stated age in NAD lying in bed  Cardiovascular: RRR without murmurs, gallops or friction rubs  Respiratory: CTAB without wheezing or crackles  Abdomen: soft, NT, bowel sounds present  Extremities: no edema   Laboratory: Recent Labs  Lab 10/03/19 0154 10/03/19 1158 10/04/19 0346  WBC 7.9 15.4* 9.8  HGB 14.4 11.2* 11.7*  HCT 46.1 36.3* 38.0*  PLT 187 152 152   Recent Labs  Lab 10/03/19 0154 10/03/19 1158 10/04/19 0346  NA 142  --  140  K 3.0*  --  4.0  CL 109  --  106  CO2 21*  --  23  BUN 7  --  5*  CREATININE 1.06 0.86 0.76  CALCIUM 8.6*  --  8.8*  PROT 6.9  --   --   BILITOT 1.4*  --   --   ALKPHOS 44  --   --   ALT 28  --   --   AST 22  --   --   GLUCOSE 177*  --  106*    Imaging/Diagnostic Tests: No new imaging   Stark Klein, MD 10/04/2019, 10:30 AM PGY-1, [facility] Intern  pager: [phone], text pages welcome

## 2019-10-04 NOTE — Progress Notes (Signed)
PIV removed with catheter intact dry dressing applied. Discharge instructions given to patient. Patient taken out in wheelchair to car for discharge home to care of wife. No further changes noted.

## 2019-10-04 NOTE — Evaluation (Signed)
PT Cancellation Note  Patient Details Name: Todd Miller MRN: [id number] DOB: 1986-05-12   Cancelled Treatment:    Reason Eval/Treat Not Completed: PT screened, no needs identified, will sign off. Patient is independent with mobility.    Fleda Pagel 10/04/2019, 10:41 AM

## 2019-10-04 NOTE — Discharge Summary (Signed)
Family Medicine Teaching [facility] Discharge Summary  Patient name: Todd Miller [facility] Medical record number: [id number] Date of birth: 05-Oct-1986 Age: 33 y.o. Gender: male Date of Admission: 10/03/2019  Date of Discharge: 10/04/19 Admitting Physician: Garnette Gunner, MD  Primary Care Provider: Mirian Mo, MD Consultants: Cardiology   Indication for Hospitalization: ACS Rule out 2/2 to chest pain   Discharge Diagnoses/Problem List:   Active Problems:   Chest pain  Disposition: discharged home   Discharge Condition: stable, improved   Discharge Exam:   General: male appearing stated age in NAD lying in bed  Cardiovascular: RRR without murmurs, gallops or friction rubs  Respiratory: CTAB without wheezing or crackles  Abdomen: soft, NT, bowel sounds present  Extremities: no edema   Brief Hospital Course:   Rakesh Dutko is a 33 y.o. male who presented with chest pain, SOB, hypotension (74/48), diaphoresis and unresponsive. Patient reported having chest pain for which he took a hot bath and then felt SOB with leg weakness so his family called EMS. EMS found the patient hypoxic and hypotensive. Upon arrival to the ED, he was found to have elevated troponin to max of 717. EKG had no changes. He was treated with GI cocktail given burning chest pain that did not respond to nitroglycerin and evaluated by cardiology. Patient reported questionable history of an infection around his heart previously. Chest CT was notable for no coronary calcification.  Patient was tested for COVID (negative), had negative UDS, normal CRP and fibrinogen as well as echocardiogram that showed no abnormalities with normal ejection fraction, valves and heart motion. This was deemed to be a likely syncopal event with troponin elevated due to demand ischemia in setting of hypotension 2/2 to dehydration. Patient was advised to maintain adequate hydration and avoid excessive heat with follow up as  needed.   Issues for Follow Up:  1. Check for resolution of chest pain. Assess for any GERD symptoms as patient had pain resolution with GI cocktail. May benefit from protonix.  2. Recommend that patient have repeat orthostatic vital signs repeated at follow up.   Significant Procedures: none  Significant Labs and Imaging:  Recent Labs  Lab 10/03/19 0154 10/03/19 1158 10/04/19 0346  WBC 7.9 15.4* 9.8  HGB 14.4 11.2* 11.7*  HCT 46.1 36.3* 38.0*  PLT 187 152 152   Recent Labs  Lab 10/03/19 0154 10/03/19 0605 10/03/19 1158 10/04/19 0346  NA 142  --   --  140  K 3.0*  --   --  4.0  CL 109  --   --  106  CO2 21*  --   --  23  GLUCOSE 177*  --   --  106*  BUN 7  --   --  5*  CREATININE 1.06  --  0.86 0.76  CALCIUM 8.6*  --   --  8.8*  MG  --  1.6*  --   --   ALKPHOS 44  --   --   --   AST 22  --   --   --   ALT 28  --   --   --   ALBUMIN 4.1  --   --   --    Results/Tests Pending at Time of Discharge: none  Discharge Medications:  Allergies as of 10/04/2019   No Known Allergies     Medication List    TAKE these medications   amitriptyline 25 MG tablet Commonly known as: ELAVIL Take 1 tablet (25  mg total) by mouth at bedtime.   gabapentin 100 MG capsule Commonly known as: NEURONTIN Take 1 capsule (100 mg total) by mouth 3 (three) times daily.   oxyCODONE-acetaminophen 10-325 MG tablet Commonly known as: PERCOCET Take 1 tablet by mouth every 4 (four) hours as needed for pain.       Discharge Instructions: Please refer to Patient Instructions section of EMR for full details.  Patient was counseled important signs and symptoms that should prompt return to medical care, changes in medications, dietary instructions, activity restrictions, and follow up appointments.   Follow-Up Appointments: Follow-up [place] Follow up on 10/06/2019.   Why: @ 9:10am Contact information: [address]          Stark Klein, MD 10/04/2019, 3:26 PM PGY-1, [facility]

## 2019-10-04 NOTE — Progress Notes (Signed)
Dr.Walsh returned my page informed her that patient is asking to go home reports needs get back to his kids at home,he has removed his tele will not put tele back on. The wife is anxious has been up to nurses station several times asking for [place] to discharge him explained to wife and husband have no order from doctor will page the doctor and inform of request. Dr.Walsh aware reported would talk with main MD in charge of patient's care and discuss if they can discharge patient and let me know something. Will inform patient and wife of above.

## 2019-10-04 NOTE — Plan of Care (Signed)

## 2019-10-04 NOTE — Discharge Instructions (Signed)
You were admitted for chest pain. Your cardiac workup was negative, indicating your pain is not coming from your heart. Your symptoms improved with GI medication which suggests your pain may be from acid reflux. You will be tested for H pylori in the clinic which is a bacteria that can cause acid reflux. You may started on a medication to help with this depending on those results. Follow up with your primary doctor as scheduled.

## 2019-10-06 ENCOUNTER — Other Ambulatory Visit: Payer: Self-pay

## 2019-10-06 ENCOUNTER — Ambulatory Visit (INDEPENDENT_AMBULATORY_CARE_PROVIDER_SITE_OTHER): Payer: Medicaid Other | Admitting: Family Medicine

## 2019-10-06 VITALS — BP 112/78 | HR 72 | Wt 150.0 lb

## 2019-10-06 DIAGNOSIS — F17200 Nicotine dependence, unspecified, uncomplicated: Secondary | ICD-10-CM | POA: Diagnosis not present

## 2019-10-06 DIAGNOSIS — R079 Chest pain, unspecified: Secondary | ICD-10-CM | POA: Diagnosis present

## 2019-10-06 MED ORDER — PANTOPRAZOLE SODIUM 40 MG PO TBEC: 40.0000 mg | DELAYED_RELEASE_TABLET | Freq: Every day | ORAL | 0 refills | Status: AC

## 2019-10-06 NOTE — Progress Notes (Signed)
  Subjective:  Patient ID: Todd Miller  DOB: 04/14/1986 MRN: [id number]  Todd Miller is a 33 y.o. male here today for hospital f/u.   HPI:  Hospital f/u for chest pain: - presented with chest pain, SOB, hypotension (74/48), diaphoresis and unresponsive. - Upon arrival to the ED, he was found to have elevated troponin to max of 717. EKG had no changes.  - Cardiology was consulted deemed to be a likely syncopal event with troponin elevated due to demand ischemia in setting of hypotension 2/2 to dehydration. - echocardiogram that showed no abnormalities with normal ejection fraction, valves and heart motion.  - Chest CT was notable for no coronary calcification. - Denies any SOB, but does endorse burning chest pain that he had in hospital, reports it improved with GI cocktail> does not worsen with exertion - reports no N/V, denies any hx of GERD or acid reflux  ROS: As mentioned in HPI  Family hx: no known family hx of heart disease Social hx: Denies use of illicit drugs, alcohol use Smoking status reviewed- smokes hookah but plans to decrease  Patient Active Problem List   Diagnosis Date Noted  . Chest pain 10/03/2019  . Tinea versicolor 09/28/2016  . Chronic bilateral low back pain with bilateral sciatica 07/17/2016  . Poor appetite 04/30/2016  . Globus sensation 04/18/2016  . Anemia 02/19/2016  . Vitamin D deficiency 02/19/2016  . Anxiety state 01/23/2016  . Refugee health examination 01/23/2016  . Tobacco use disorder 01/23/2016     Objective:  BP 112/78   Pulse 72   Wt 150 lb (68 kg)   SpO2 98%   BMI 20.92 kg/m   Vitals and nursing note reviewed  General: NAD, pleasant Pulm: normal effort, CTAB Cardiac: RRR, normal heart sounds, no m/r/g Extremities: no edema or cyanosis. WWP. Skin: warm and dry, no rashes noted Neuro: alert and oriented, no focal deficits Psych: normal affect, normal thought content  Assessment & Plan:   Tobacco use  disorder Counseled on need to decrease hookah smoking  Chest pain No further dizziness or syncopal episodes. Cardiology was consulted in hospital deemed to be a likely syncopal event with troponin elevated due to demand ischemia in setting of hypotension 2/2 to dehydration. Orthostatic vitals in office WNL. In hospital, Echocardiogram was wnl, Chest CT with no coronary calcification and cards only wanted to see prn. Patient still endorses a "burning" chest pain in the center of his chest that has somewhat improved since hospitalization, likely 2/2 acid reflux given improvement with GI cocktail and sx; may benefit from h pylori testing if sx continue (2 weeks after d/c of ppi) - will trial 2 weeks of PPI, counseled on smoking cessation and dietary changes - patient given strict return precautions to ED given his elevated troponin and he voiced understanding     [place] Faron Tudisco, [place] Resident PGY-3

## 2019-10-06 NOTE — Patient Instructions (Signed)
Thank you for coming to see me today. It was a pleasure! Today we talked about:   After your hospitalization, I am glad that your chest pain has not gotten any worse.  I recommend that you use the Protonix for 2 weeks as we discussed.  Again if you start having any shortness of breath or chest pain that is worse with exertion and do not hesitate to come back to the emergency room or to our clinic in regular hours.  Please decrease the amount of food that you are smoking as this has nicotine which increases risk for cardiac disease.  Please follow-up with your regular doctor as needed.  If you have any questions or concerns, please do not hesitate to call the office at [phone].  Take Care,   Martin[place] Verbon Giangregorio, DO  Nonspecific Chest Pain Chest pain can be caused by many different conditions. Some causes of chest pain can be life-threatening. These will require treatment right away. Serious causes of chest pain include:  Heart attack.  A tear in the body's main blood vessel.  Redness and swelling (inflammation) around your heart.  Blood clot in your lungs. Other causes of chest pain may not be so serious. These include:  Heartburn.  Anxiety or stress.  Damage to bones or muscles in your chest.  Lung infections. Chest pain can feel like:  Pain or discomfort in your chest.  Crushing, pressure, aching, or squeezing pain.  Burning or tingling.  Dull or sharp pain that is worse when you move, cough, or take a deep breath.  Pain or discomfort that is also felt in your back, neck, jaw, shoulder, or arm, or pain that spreads to any of these areas. It is hard to know whether your pain is caused by something that is serious or something that is not so serious. So it is important to see your doctor right away if you have chest pain. Follow these instructions at home: Medicines  Take over-the-counter and prescription medicines only as told by your doctor.  If you were  prescribed an antibiotic medicine, take it as told by your doctor. Do not stop taking the antibiotic even if you start to feel better. Lifestyle   Rest as told by your doctor.  Do not use any products that contain nicotine or tobacco, such as cigarettes, e-cigarettes, and chewing tobacco. If you need help quitting, ask your doctor.  Do not drink alcohol.  Make lifestyle changes as told by your doctor. These may include: ? Getting regular exercise. Ask your doctor what activities are safe for you. ? Eating a heart-healthy diet. A diet and nutrition specialist (dietitian) can help you to learn healthy eating options. ? Staying at a healthy weight. ? Treating diabetes or high blood pressure, if needed. ? Lowering your stress. Activities such as yoga and relaxation techniques can help. General instructions  Pay attention to any changes in your symptoms. Tell your doctor about them or any new symptoms.  Avoid any activities that cause chest pain.  Keep all follow-up visits as told by your doctor. This is important. You may need more testing if your chest pain does not go away. Contact a doctor if:  Your chest pain does not go away.  You feel depressed.  You have a fever. Get help right away if:  Your chest pain is worse.  You have a cough that gets worse, or you cough up blood.  You have very bad (severe) pain in your belly (abdomen).  You pass out (faint).  You have either of these for no clear reason: ? Sudden chest discomfort. ? Sudden discomfort in your arms, back, neck, or jaw.  You have shortness of breath at any time.  You suddenly start to sweat, or your skin gets clammy.  You feel sick to your stomach (nauseous).  You throw up (vomit).  You suddenly feel lightheaded or dizzy.  You feel very weak or tired.  Your heart starts to beat fast, or it feels like it is skipping beats. These symptoms may be an emergency. Do not wait to see if the symptoms will go  away. Get medical help right away. Call your local emergency services (911 in the [place] Do not drive yourself to the hospital. Summary  Chest pain can be caused by many different conditions. The cause may be serious and need treatment right away. If you have chest pain, see your doctor right away.  Follow your doctor's instructions for taking medicines and making lifestyle changes.  Keep all follow-up visits as told by your doctor. This includes visits for any further testing if your chest pain does not go away.  Be sure to know the signs that show that your condition has become worse. Get help right away if you have these symptoms. This information is not intended to replace advice given to you by your health care provider. Make sure you discuss any questions you have with your health care provider. Document Released: 05/26/2008 Document Revised: 06/10/2018 Document Reviewed: 06/10/2018 Elsevier Patient Education  2020 [employer].

## 2019-10-08 LAB — CULTURE, BLOOD (ROUTINE X 2)
Culture: NO GROWTH
Culture: NO GROWTH
Special Requests: ADEQUATE

## 2019-10-09 NOTE — Assessment & Plan Note (Addendum)
No further dizziness or syncopal episodes. Cardiology was consulted in hospital deemed to be a likely syncopal event with troponin elevated due to demand ischemia in setting of hypotension 2/2 to dehydration. Orthostatic vitals in office WNL. In hospital, Echocardiogram was wnl, Chest CT with no coronary calcification and cards only wanted to see prn. Patient still endorses a "burning" chest pain in the center of his chest that has somewhat improved since hospitalization, likely 2/2 acid reflux given improvement with GI cocktail and sx; may benefit from h pylori testing if sx continue (2 weeks after d/c of ppi) - will trial 2 weeks of PPI, counseled on smoking cessation and dietary changes - patient given strict return precautions to ED given his elevated troponin and he voiced understanding

## 2019-10-09 NOTE — Assessment & Plan Note (Signed)
Counseled on need to decrease hookah smoking

## 2020-06-11 ENCOUNTER — Emergency Department (HOSPITAL_COMMUNITY): Payer: Medicaid Other

## 2020-06-11 ENCOUNTER — Encounter (HOSPITAL_COMMUNITY): Payer: Self-pay

## 2020-06-11 ENCOUNTER — Ambulatory Visit (HOSPITAL_COMMUNITY)
Admission: EM | Admit: 2020-06-11 | Discharge: 2020-06-11 | Disposition: A | Discharge status: Home / Self Care | Payer: Medicaid Other

## 2020-06-11 ENCOUNTER — Emergency Department (HOSPITAL_COMMUNITY)
Admission: EM | Admit: 2020-06-11 | Discharge: 2020-06-12 | Disposition: A | Discharge status: Home / Self Care | Payer: Medicaid Other | Attending: Emergency Medicine | Admitting: Emergency Medicine

## 2020-06-11 DIAGNOSIS — T1490XA Injury, unspecified, initial encounter: Secondary | ICD-10-CM

## 2020-06-11 DIAGNOSIS — Y999 Unspecified external cause status: Secondary | ICD-10-CM | POA: Insufficient documentation

## 2020-06-11 DIAGNOSIS — M546 Pain in thoracic spine: Secondary | ICD-10-CM | POA: Diagnosis not present

## 2020-06-11 DIAGNOSIS — S300XXA Contusion of lower back and pelvis, initial encounter: Secondary | ICD-10-CM | POA: Insufficient documentation

## 2020-06-11 DIAGNOSIS — S299XXA Unspecified injury of thorax, initial encounter: Secondary | ICD-10-CM | POA: Insufficient documentation

## 2020-06-11 DIAGNOSIS — Y9389 Activity, other specified: Secondary | ICD-10-CM | POA: Diagnosis not present

## 2020-06-11 DIAGNOSIS — S3992XA Unspecified injury of lower back, initial encounter: Secondary | ICD-10-CM | POA: Diagnosis present

## 2020-06-11 DIAGNOSIS — S0990XA Unspecified injury of head, initial encounter: Secondary | ICD-10-CM | POA: Diagnosis not present

## 2020-06-11 DIAGNOSIS — F17290 Nicotine dependence, other tobacco product, uncomplicated: Secondary | ICD-10-CM | POA: Diagnosis not present

## 2020-06-11 DIAGNOSIS — W11XXXA Fall on and from ladder, initial encounter: Secondary | ICD-10-CM | POA: Diagnosis not present

## 2020-06-11 DIAGNOSIS — S199XXA Unspecified injury of neck, initial encounter: Secondary | ICD-10-CM | POA: Insufficient documentation

## 2020-06-11 DIAGNOSIS — W19XXXA Unspecified fall, initial encounter: Secondary | ICD-10-CM

## 2020-06-11 DIAGNOSIS — Y929 Unspecified place or not applicable: Secondary | ICD-10-CM | POA: Diagnosis not present

## 2020-06-11 NOTE — ED Triage Notes (Signed)
Pt fell from ladder approx 8 ft. Pt c/o 10/10 back pain. Pt AOx4, neuro intact

## 2020-06-12 ENCOUNTER — Emergency Department (HOSPITAL_COMMUNITY): Payer: Medicaid Other

## 2020-06-12 LAB — BASIC METABOLIC PANEL
Anion gap: 10 (ref 5–15)
BUN: 10 mg/dL (ref 6–20)
CO2: 24 mmol/L (ref 22–32)
Calcium: 9.2 mg/dL (ref 8.9–10.3)
Chloride: 107 mmol/L (ref 98–111)
Creatinine, Ser: 0.89 mg/dL (ref 0.61–1.24)
GFR calc Af Amer: >60 mL/min (ref >60)
GFR calc non Af Amer: >60 mL/min (ref >60)
Glucose, Bld: 117 mg/dL — ABNORMAL HIGH (ref 70–99)
Potassium: 3.5 mmol/L (ref 3.5–5.1)
Sodium: 141 mmol/L (ref 135–145)

## 2020-06-12 LAB — CBC WITH DIFFERENTIAL/PLATELET
Abs Immature Granulocytes: 0.02 10*3/uL (ref 0.00–0.07)
Basophils Absolute: 0 10*3/uL (ref 0.0–0.1)
Basophils Relative: 0 %
Eosinophils Absolute: 0.1 10*3/uL (ref 0.0–0.5)
Eosinophils Relative: 1 %
HCT: 40.6 % (ref 39.0–52.0)
Hemoglobin: 12.3 g/dL — ABNORMAL LOW (ref 13.0–17.0)
Immature Granulocytes: 0 %
Lymphocytes Relative: 34 %
Lymphs Abs: 2.6 10*3/uL (ref 0.7–4.0)
MCH: 19.4 pg — ABNORMAL LOW (ref 26.0–34.0)
MCHC: 30.3 g/dL (ref 30.0–36.0)
MCV: 64 fL — ABNORMAL LOW (ref 80.0–100.0)
Monocytes Absolute: 0.7 10*3/uL (ref 0.1–1.0)
Monocytes Relative: 9 %
Neutro Abs: 4.3 10*3/uL (ref 1.7–7.7)
Neutrophils Relative %: 56 %
Platelets: 161 10*3/uL (ref 150–400)
RBC: 6.34 MIL/uL — ABNORMAL HIGH (ref 4.22–5.81)
RDW: 15.2 % (ref 11.5–15.5)
WBC: 7.7 10*3/uL (ref 4.0–10.5)
nRBC: 0 % (ref 0.0–0.2)

## 2020-06-12 LAB — URINALYSIS, ROUTINE W REFLEX MICROSCOPIC
Bilirubin Urine: NEGATIVE
Glucose, UA: NEGATIVE mg/dL
Hgb urine dipstick: NEGATIVE
Ketones, ur: NEGATIVE mg/dL
Leukocytes,Ua: NEGATIVE
Nitrite: NEGATIVE
Protein, ur: NEGATIVE mg/dL
Specific Gravity, Urine: 1.04 — ABNORMAL HIGH (ref 1.005–1.030)
pH: 6 (ref 5.0–8.0)

## 2020-06-12 MED ORDER — NAPROXEN 500 MG PO TABS
500.0000 mg | ORAL_TABLET | Freq: Two times a day (BID) | ORAL | 0 refills | Status: DC | PRN
Start: 2020-06-12 — End: 2022-04-12

## 2020-06-12 MED ORDER — IOHEXOL 300 MG/ML  SOLN
100.0000 mL | Freq: Once | INTRAMUSCULAR | Status: AC | PRN
  Administered 2020-06-12: 100 mL via INTRAVENOUS

## 2020-06-12 MED ORDER — NAPROXEN 500 MG PO TABS
500.0000 mg | ORAL_TABLET | Freq: Two times a day (BID) | ORAL | 0 refills | Status: DC | PRN
Start: 2020-06-12 — End: 2020-06-12

## 2020-06-12 MED ORDER — METHOCARBAMOL 500 MG PO TABS
500.0000 mg | ORAL_TABLET | Freq: Three times a day (TID) | ORAL | 0 refills | Status: AC | PRN
Start: 2020-06-12 — End: ?

## 2020-06-12 MED ORDER — OXYCODONE-ACETAMINOPHEN 5-325 MG PO TABS
2.0000 | ORAL_TABLET | Freq: Once | ORAL | Status: AC
  Administered 2020-06-12: 2 via ORAL
  Filled 2020-06-12: qty 2

## 2020-06-12 MED ORDER — HYDROMORPHONE HCL 1 MG/ML IJ SOLN
1.0000 mg | Freq: Once | INTRAMUSCULAR | Status: AC
  Administered 2020-06-12: 1 mg via INTRAVENOUS
  Filled 2020-06-12: qty 1

## 2020-06-12 MED ORDER — METHOCARBAMOL 500 MG PO TABS
500.0000 mg | ORAL_TABLET | Freq: Three times a day (TID) | ORAL | 0 refills | Status: DC | PRN
Start: 2020-06-12 — End: 2020-06-12

## 2020-06-12 NOTE — ED Notes (Signed)
Pt verbalized understanding of d/c paperwork via tele interpreter. Refused w/c, wants to walk to POV for d/c

## 2020-06-12 NOTE — ED Notes (Signed)
Patient educated about not driving or performing other critical tasks (such as operating heavy machinery, caring for infant/toddler/child) due to sedative nature of narcotic medications received while in the ED.  Pt/caregiver verbalized understanding, says his wife will drive him home. Patient aware that we need urine sample for testing, unable at this time. Pt given instruction on providing urine sample when able to do so.

## 2020-06-12 NOTE — ED Notes (Signed)
ED Provider at bedside. 

## 2020-06-12 NOTE — ED Provider Notes (Signed)
[facility] EMERGENCY DEPARTMENT Provider Note   CSN: [id number] Arrival date & time: 06/11/20  4098     History Chief Complaint  Patient presents with  . Fall    Todd Miller is a 34 y.o. male.  Level 5 caveat for language barrier.  Translator used.  Patient here with low back pain after falling approximately 8 feet from a ladder.  States he landed on his low back and buttocks.  Denies hitting his head or losing consciousness.  Has chronic back pain at baseline but pain is now much worse. Takes oxycodone/APAP on a regular basis but ran out early several days ago. Cannot get refill until June 28.  No previous back surgeries.  Pain radiates down both legs.  He denies any numbness or tingling in his legs.  No loss of bowel or bladder control.  Denies any head, neck, upper back pain.  No chest or abdominal pain.  No blood thinner use.  The history is provided by the patient and a relative. The history is limited by a language barrier. A language interpreter was used.  Fall Pertinent negatives include no chest pain, no abdominal pain, no headaches and no shortness of breath.       History reviewed. No pertinent past medical history.  Patient Active Problem List   Diagnosis Date Noted  . Chest pain 10/03/2019  . Tinea versicolor 09/28/2016  . Chronic bilateral low back pain with bilateral sciatica 07/17/2016  . Poor appetite 04/30/2016  . Globus sensation 04/18/2016  . Anemia 02/19/2016  . Vitamin D deficiency 02/19/2016  . Anxiety state 01/23/2016  . Refugee health examination 01/23/2016  . Tobacco use disorder 01/23/2016    Past Surgical History:  Procedure Laterality Date  . HAND SURGERY         Family History  Problem Relation Age of Onset  . Healthy Mother   . Healthy Father     Social History   Tobacco Use  . Smoking status: Current Some Day Smoker    Types: Pipe  . Smokeless tobacco: Never Used  Substance Use Topics  . Alcohol  use: Not on file  . Drug use: Not on file    Home Medications Prior to Admission medications   Medication Sig Start Date End Date Taking? Authorizing Provider  amitriptyline (ELAVIL) 25 MG tablet Take 1 tablet (25 mg total) by mouth at bedtime. 12/02/17   Tyrone Bing, DO  gabapentin (NEURONTIN) 100 MG capsule Take 1 capsule (100 mg total) by mouth 3 (three) times daily. 02/17/18   Mcarthur Rossetti, MD  oxyCODONE-acetaminophen (PERCOCET) 10-325 MG tablet Take 1 tablet by mouth every 4 (four) hours as needed for pain.    [provider]  pantoprazole (PROTONIX) 40 MG tablet Take 1 tablet (40 mg total) by mouth daily for 14 days. 10/06/19 10/20/19  Shirley, [place], DO    Allergies    Patient has no known allergies.  Review of Systems   Review of Systems  Constitutional: Negative for activity change, appetite change and fever.  HENT: Negative for congestion and rhinorrhea.   Respiratory: Negative for cough, chest tightness and shortness of breath.   Cardiovascular: Negative for chest pain.  Gastrointestinal: Negative for abdominal pain, nausea and vomiting.  Genitourinary: Negative for dysuria, hematuria, testicular pain and urgency.  Musculoskeletal: Positive for arthralgias, back pain and myalgias. Negative for neck pain.  Skin: Negative for rash.  Neurological: Negative for dizziness, weakness and headaches.   all other  systems are negative except as noted in the HPI and PMH.    Physical Exam Updated Vital Signs BP 115/72 (BP Location: Left Arm)   Pulse 68   Temp 98.1 F (36.7 C) (Oral)   Resp 20   SpO2 99%   Physical Exam Vitals and nursing note reviewed.  Constitutional:      General: He is not in acute distress.    Appearance: He is well-developed.     Comments: Uncomfortable  HENT:     Head: Normocephalic and atraumatic.     Mouth/Throat:     Pharynx: No oropharyngeal exudate.  Eyes:     Conjunctiva/sclera: Conjunctivae normal.     Pupils:  Pupils are equal, round, and reactive to light.  Neck:     Comments: No midline C-spine tenderness Cardiovascular:     Rate and Rhythm: Normal rate and regular rhythm.     Heart sounds: Normal heart sounds. No murmur heard.   Pulmonary:     Effort: Pulmonary effort is normal. No respiratory distress.     Breath sounds: Normal breath sounds.  Chest:     Chest wall: No tenderness.  Abdominal:     Palpations: Abdomen is soft.     Tenderness: There is no abdominal tenderness. There is no guarding or rebound.     Comments: Soft and nontender  Musculoskeletal:        General: Tenderness present. Normal range of motion.     Cervical back: Normal range of motion and neck supple.     Comments: Pelvis stable.  Midline lumbar tenderness without step-off or deformity.  Rectal tone intact.  5/5 strength in bilateral lower extremities. Ankle plantar and dorsiflexion intact. Great toe extension intact bilaterally. +2 DP and PT pulses.  +2 patellar reflexes bilaterally  Skin:    General: Skin is warm.     Capillary Refill: Capillary refill takes less than 2 seconds.  Neurological:     General: No focal deficit present.     Mental Status: He is alert and oriented to person, place, and time. Mental status is at baseline.     Cranial Nerves: No cranial nerve deficit.     Motor: No abnormal muscle tone.     Coordination: Coordination normal.     Comments: No ataxia on finger to nose bilaterally. No pronator drift. 5/5 strength throughout. CN 2-12 intact.Equal grip strength. Sensation intact.   Psychiatric:        Behavior: Behavior normal.     ED Results / Procedures / Treatments   Labs (all labs ordered are listed, but only abnormal results are displayed) Labs Reviewed  CBC WITH DIFFERENTIAL/PLATELET - Abnormal; Notable for the following components:      Result Value   RBC 6.34 (*)    Hemoglobin 12.3 (*)    MCV 64.0 (*)    MCH 19.4 (*)    All other components within normal limits  BASIC  METABOLIC PANEL - Abnormal; Notable for the following components:   Glucose, Bld 117 (*)    All other components within normal limits  URINALYSIS, ROUTINE W REFLEX MICROSCOPIC    EKG None  Radiology DG Lumbar Spine 2-3 Views  Result Date: 06/11/2020 CLINICAL DATA:  Fall from ladder.  Low back pain. EXAM: LUMBAR SPINE - 2-3 VIEW COMPARISON:  01/27/2018 FINDINGS: There is no evidence of lumbar spine fracture. Alignment is normal. Intervertebral disc spaces are maintained. L5 limbus vertebra less apparent than on 01/27/2018. IMPRESSION: No fracture or listhesis of the  lumbar spine. Electronically Signed   By: Deatra Robinson M.D.   On: 06/11/2020 21:37   CT Head Wo Contrast  Result Date: 06/11/2020 CLINICAL DATA:  Status post trauma. EXAM: CT HEAD WITHOUT CONTRAST TECHNIQUE: Contiguous axial images were obtained from the base of the skull through the vertex without intravenous contrast. COMPARISON:  None. FINDINGS: Brain: There is mild cerebral atrophy with widening of the extra-axial spaces and ventricular dilatation. There are areas of decreased attenuation within the white matter tracts of the supratentorial brain, consistent with microvascular disease changes. Vascular: No hyperdense vessel or unexpected calcification. Skull: Normal. Negative for fracture or focal lesion. Sinuses/Orbits: There is a 2.1 cm x 1.7 cm right maxillary sinus polyp versus mucous retention cyst. This is seen on the prior study. Other: None. IMPRESSION: 1. Generalized cerebral atrophy. 2. No acute intracranial abnormality. 3. Right maxillary sinus polyp versus mucous retention cyst. Electronically Signed   By: Aram Candela M.D.   On: 06/11/2020 20:30   CT Chest W Contrast  Result Date: 06/12/2020 CLINICAL DATA:  Fall from ladder approximately 8 feet now with 10/10 back pain EXAM: CT CHEST, ABDOMEN, AND PELVIS WITH CONTRAST TECHNIQUE: Multidetector CT imaging of the chest, abdomen and pelvis was performed following the  standard protocol during bolus administration of intravenous contrast. CONTRAST:  OMNIPAQUE IOHEXOL 300 MG/ML  SOLN COMPARISON:  Contemporary CT thoracic and lumbar spine reconstructions. Same day chest radiograph. Angio chest 10/03/2019, MR lumbar spine 02/16/2018, CT L-spine 07/17/2017, CT abdomen pelvis 01/07/2016 FINDINGS: CT CHEST FINDINGS Cardiovascular: The aortic root and ascending aorta is suboptimally assessed given cardiac pulsation artifact. The aorta is normal caliber. No acute luminal abnormality of the aorta is seen. No periaortic stranding or hemorrhage. Shared origin of the brachiocephalic and left common carotid artery. Proximal great vessels opacified normally without acute luminal abnormality. Motion artifact in the right common carotid mimicking a dissection flap. Normal heart size. No pericardial effusion. Central pulmonary arteries are normal caliber. No large central or lobar filling defects on this non tailored examination of the pulmonary arteries. Mediastinum/Nodes: Fatty stippling of the anterior mediastinum is similar to the comparison CT of the chest and most likely reflects a thymic remnant No mediastinal fluid or gas. Normal thyroid gland and thoracic inlet. No acute abnormality of the trachea or esophagus. No worrisome mediastinal, hilar or axillary adenopathy. Lungs/Pleura: There are dependent atelectatic changes posteriorly. No definite acute traumatic findings of the lung parenchyma. No consolidation, features of edema, pneumothorax, or effusion. No suspicious pulmonary nodules or masses. Musculoskeletal: Dedicated thoracic spine reconstructions are generated and dictated separately. Please see report for full details. No visible displaced rib fractures or other acute traumatic osseous injury of the chest wall. Included portions of the shoulders and clavicles are free of acute traumatic osseous injury as well. Mild degenerative changes in the shoulders. Some mild contusive  posterior body wall changes are present. CT ABDOMEN PELVIS FINDINGS Hepatobiliary: No direct hepatic injury or perihepatic hematoma. No focal liver abnormality is seen. Gallbladder largely decompressed without visible gallstones, gallbladder wall thickening, pericholecystic inflammation or biliary dilatation. Pancreas: No pancreatic contusive changes or ductal disruption. No inflammation or discernible lesions. Spleen: Heterogeneous splenic enhancement is likely related to the phase of contrast timing. This normalizes on delayed phase imaging without concerning focal splenic injury, perisplenic hematoma, or worrisome splenic lesion. Borderline splenomegaly is noted. Adrenals/Urinary Tract: No adrenal hemorrhage or suspicious adrenal lesions. Kidneys enhance and excrete symmetrically without extravasation of contrast on excretory delayed phase imaging. No direct  renal injury or perinephric hematoma. No suspicious renal lesion, urolithiasis or hydronephrosis. No evidence of direct bladder injury or other acute bladder abnormality. Stomach/Bowel: Distal esophagus, stomach and duodenal sweep are unremarkable. No small bowel wall thickening or dilatation. No evidence of obstruction. A normal appendix is visualized. No colonic dilatation or wall thickening. No visible mesenteric hematoma or contusive change. Vascular/Lymphatic: No acute or significant vascular findings are present. No sites of active contrast extravasation. No enlarged abdominal or pelvic lymph nodes. Reproductive: The prostate and seminal vesicles are unremarkable. No acute traumatic abnormality of the external genitalia. Other: No traumatic abdominal wall dehiscence or large body wall hematoma. No retroperitoneal hematoma. Small fat containing umbilical hernia. No bowel containing hernias. No abdominopelvic free air, fluid or hemorrhage. Musculoskeletal: Dedicated lumbar reconstructions are generated and dictated separately. Please see report for  further details. IMPRESSION: 1. Mild likely contusive changes of the posterior chest wall. 2. No evidence of other acute traumatic injury within the chest, abdomen, or pelvis. 3. Fatty stippling of the anterior mediastinum is similar to the comparison CT of the chest and most likely reflects a thymic remnant in the absence of other traumatic findings. 4. Dedicated thoracic and lumbar spine reconstructions are generated and dictated separately. Please see report for full details. Electronically Signed   By: Kreg Shropshire M.D.   On: 06/12/2020 05:45   CT Cervical Spine Wo Contrast  Result Date: 06/11/2020 CLINICAL DATA:  34 year old male with trauma. EXAM: CT CERVICAL SPINE WITHOUT CONTRAST TECHNIQUE: Multidetector CT imaging of the cervical spine was performed without intravenous contrast. Multiplanar CT image reconstructions were also generated. COMPARISON:  None. FINDINGS: Alignment: No acute subluxation. There is straightening of normal cervical lordosis. Skull base and vertebrae: No acute fracture Soft tissues and spinal canal: No prevertebral fluid or swelling. No visible canal hematoma. Disc levels: No acute findings. No significant degenerative changes. Upper chest: Negative. Other: None IMPRESSION: No acute/traumatic cervical spine pathology. Electronically Signed   By: Elgie Collard M.D.   On: 06/11/2020 20:30   CT ABDOMEN PELVIS W CONTRAST  Result Date: 06/12/2020 CLINICAL DATA:  Fall from ladder approximately 8 feet now with 10/10 back pain EXAM: CT CHEST, ABDOMEN, AND PELVIS WITH CONTRAST TECHNIQUE: Multidetector CT imaging of the chest, abdomen and pelvis was performed following the standard protocol during bolus administration of intravenous contrast. CONTRAST:  OMNIPAQUE IOHEXOL 300 MG/ML  SOLN COMPARISON:  Contemporary CT thoracic and lumbar spine reconstructions. Same day chest radiograph. Angio chest 10/03/2019, MR lumbar spine 02/16/2018, CT L-spine 07/17/2017, CT abdomen pelvis  01/07/2016 FINDINGS: CT CHEST FINDINGS Cardiovascular: The aortic root and ascending aorta is suboptimally assessed given cardiac pulsation artifact. The aorta is normal caliber. No acute luminal abnormality of the aorta is seen. No periaortic stranding or hemorrhage. Shared origin of the brachiocephalic and left common carotid artery. Proximal great vessels opacified normally without acute luminal abnormality. Motion artifact in the right common carotid mimicking a dissection flap. Normal heart size. No pericardial effusion. Central pulmonary arteries are normal caliber. No large central or lobar filling defects on this non tailored examination of the pulmonary arteries. Mediastinum/Nodes: Fatty stippling of the anterior mediastinum is similar to the comparison CT of the chest and most likely reflects a thymic remnant No mediastinal fluid or gas. Normal thyroid gland and thoracic inlet. No acute abnormality of the trachea or esophagus. No worrisome mediastinal, hilar or axillary adenopathy. Lungs/Pleura: There are dependent atelectatic changes posteriorly. No definite acute traumatic findings of the lung parenchyma. No consolidation,  features of edema, pneumothorax, or effusion. No suspicious pulmonary nodules or masses. Musculoskeletal: Dedicated thoracic spine reconstructions are generated and dictated separately. Please see report for full details. No visible displaced rib fractures or other acute traumatic osseous injury of the chest wall. Included portions of the shoulders and clavicles are free of acute traumatic osseous injury as well. Mild degenerative changes in the shoulders. Some mild contusive posterior body wall changes are present. CT ABDOMEN PELVIS FINDINGS Hepatobiliary: No direct hepatic injury or perihepatic hematoma. No focal liver abnormality is seen. Gallbladder largely decompressed without visible gallstones, gallbladder wall thickening, pericholecystic inflammation or biliary dilatation.  Pancreas: No pancreatic contusive changes or ductal disruption. No inflammation or discernible lesions. Spleen: Heterogeneous splenic enhancement is likely related to the phase of contrast timing. This normalizes on delayed phase imaging without concerning focal splenic injury, perisplenic hematoma, or worrisome splenic lesion. Borderline splenomegaly is noted. Adrenals/Urinary Tract: No adrenal hemorrhage or suspicious adrenal lesions. Kidneys enhance and excrete symmetrically without extravasation of contrast on excretory delayed phase imaging. No direct renal injury or perinephric hematoma. No suspicious renal lesion, urolithiasis or hydronephrosis. No evidence of direct bladder injury or other acute bladder abnormality. Stomach/Bowel: Distal esophagus, stomach and duodenal sweep are unremarkable. No small bowel wall thickening or dilatation. No evidence of obstruction. A normal appendix is visualized. No colonic dilatation or wall thickening. No visible mesenteric hematoma or contusive change. Vascular/Lymphatic: No acute or significant vascular findings are present. No sites of active contrast extravasation. No enlarged abdominal or pelvic lymph nodes. Reproductive: The prostate and seminal vesicles are unremarkable. No acute traumatic abnormality of the external genitalia. Other: No traumatic abdominal wall dehiscence or large body wall hematoma. No retroperitoneal hematoma. Small fat containing umbilical hernia. No bowel containing hernias. No abdominopelvic free air, fluid or hemorrhage. Musculoskeletal: Dedicated lumbar reconstructions are generated and dictated separately. Please see report for further details. IMPRESSION: 1. Mild likely contusive changes of the posterior chest wall. 2. No evidence of other acute traumatic injury within the chest, abdomen, or pelvis. 3. Fatty stippling of the anterior mediastinum is similar to the comparison CT of the chest and most likely reflects a thymic remnant in the  absence of other traumatic findings. 4. Dedicated thoracic and lumbar spine reconstructions are generated and dictated separately. Please see report for full details. Electronically Signed   By: Kreg Shropshire M.D.   On: 06/12/2020 05:45   DG Pelvis Portable  Result Date: 06/12/2020 CLINICAL DATA:  8 foot fall, landed on buttocks EXAM: PORTABLE PELVIS 1-2 VIEWS COMPARISON:  CT 05/27/2016 FINDINGS: Included bones of the pelvis appear intact and congruent. Limited evaluation of the sacrum and coccyx due to overlying bowel gas and lack of lateral radiograph. Proximal femora appear intact and aligned. Monitoring leads project over the abdomen. Mild soft tissue thickening, likely contusive changes of the lateral hips bilaterally. Remaining soft tissues are unremarkable. IMPRESSION: 1. No gross fracture or traumatic malalignment though limited evaluation of the sacrum and coccyx due to overlying bowel gas and lack of lateral radiograph. 2. Likely contusive changes of the lateral hips bilaterally. Electronically Signed   By: Kreg Shropshire M.D.   On: 06/12/2020 02:51   DG Chest Portable 1 View  Result Date: 06/12/2020 CLINICAL DATA:  Fall, 8 feet, landed on buttocks EXAM: PORTABLE CHEST 1 VIEW COMPARISON:  CT 10/03/2019, radiograph 10/03/2019 FINDINGS: No consolidation, features of edema, pneumothorax, or effusion. Pulmonary vascularity is normally distributed. The cardiomediastinal contours are unremarkable. No acute osseous or soft tissue abnormality.  Telemetry leads overlie the chest. IMPRESSION: No acute cardiopulmonary or traumatic findings in the chest. Electronically Signed   By: Kreg ShropshirePrice  DeHay M.D.   On: 06/12/2020 02:49    Procedures Procedures (including critical care time)  Medications Ordered in ED Medications  HYDROmorphone (DILAUDID) injection 1 mg (has no administration in time range)    ED Course  I have reviewed the triage vital signs and the nursing notes.  Pertinent labs & imaging results  that were available during my care of the patient were reviewed by me and considered in my medical decision making (see chart for details).    MDM Rules/Calculators/A&P                         Fall approximately 8 feet onto back and buttocks.  Did not hit head or lose consciousness.  No blood thinner use.  CT head and C-spine in triage are negative.  Lumbar x-ray is negative.  Patient does have midline lumbar spine pain that is reproducible.  Intact strength in lower extremities.  Intact strength, sensation and pulses in lower extremities.  CT of thoracic and lumbar spine do not show any acute fracture.  Does show chronic deformities in the thoracic spine that are unchanged.  Traumatic imaging is otherwise reassuring.  Will attempt ambulation.  Narcotic database reviewed.  Patient receives oxycodone 10 mg/acetaminophen 325 mg 120 tablets on a monthly basis.  He last received 180 tablets on May 31.  He states these are now out and he cannot refill them till June 28.  Discussed with the patient that the ED cannot refill his chronic pain medication.  Patient able to ambulate. Traumatic imaging reassuring. Low suspicion for cauda equina, cord compression, or epidural abscess.  Will treat with muscle relaxers and NSAIDs.  D/w patient that ED cannot refill his chronic pain meds especially since he is running out early. Followup with his PCP and pain specialist.  Return to the ED with weakness, numbness, incontinence, worsening pain or any other concerns.  Final Clinical Impression(s) / ED Diagnoses Final diagnoses:  Trauma  Contusion of lower back, initial encounter    Rx / DC Orders ED Discharge Orders    None       Edwinna Rochette, Jeannett SeniorStephen, MD 06/12/20 [phone]

## 2020-06-12 NOTE — ED Notes (Signed)
Patient transported to CT 

## 2020-06-12 NOTE — Discharge Instructions (Addendum)
The ED cannot refill your chronic pain medications. Your x-rays and CT scans are reassuring.  There is no evidence of fracture or dislocation.  Take the anti-inflammatories and muscle relaxers as prescribed.  Return to the ED with worsening pain, weakness, numbness, fever, loss of bowel or bladder control, or any other concerns

## 2020-06-12 NOTE — ED Notes (Signed)
Pt complaining of increased pain and shortness of breath, triage RN notified and pt taken for reassessment.

## 2020-06-13 ENCOUNTER — Emergency Department (HOSPITAL_COMMUNITY)
Admission: EM | Admit: 2020-06-13 | Discharge: 2020-06-13 | Disposition: A | Discharge status: Home / Self Care | Payer: Medicaid Other | Attending: Emergency Medicine | Admitting: Emergency Medicine

## 2020-06-13 ENCOUNTER — Encounter (HOSPITAL_COMMUNITY): Payer: Self-pay

## 2020-06-13 DIAGNOSIS — R531 Weakness: Secondary | ICD-10-CM | POA: Diagnosis not present

## 2020-06-13 DIAGNOSIS — Z5321 Procedure and treatment not carried out due to patient leaving prior to being seen by health care provider: Secondary | ICD-10-CM | POA: Diagnosis not present

## 2020-06-13 DIAGNOSIS — R0789 Other chest pain: Secondary | ICD-10-CM | POA: Insufficient documentation

## 2020-06-13 MED ORDER — SODIUM CHLORIDE 0.9% FLUSH: 3.0000 mL | Freq: Once | INTRAVENOUS | Status: DC

## 2020-06-13 NOTE — ED Notes (Signed)
Pt states that he is leaving due to wait time 

## 2020-06-13 NOTE — ED Triage Notes (Signed)
Pt comes via [facility], states that he was here yesterday for a fall and tonight he took naproxen for the first time then afterwards started to feel weak and have CP.

## 2020-07-02 ENCOUNTER — Ambulatory Visit: Payer: Medicaid Other | Attending: Nurse Practitioner | Admitting: Physical Therapy

## 2020-10-10 ENCOUNTER — Ambulatory Visit: Payer: Self-pay | Admitting: Physical Therapy

## 2020-10-30 ENCOUNTER — Ambulatory Visit: Payer: Self-pay | Admitting: Physical Therapy

## 2020-11-05 ENCOUNTER — Ambulatory Visit: Payer: Medicaid Other

## 2020-11-09 ENCOUNTER — Ambulatory Visit: Payer: Self-pay

## 2020-11-09 ENCOUNTER — Ambulatory Visit: Payer: Medicaid Other

## 2020-11-19 ENCOUNTER — Encounter: Payer: Self-pay | Admitting: Physical Therapy

## 2020-11-19 ENCOUNTER — Ambulatory Visit: Payer: Medicaid Other | Attending: Nurse Practitioner | Admitting: Physical Therapy

## 2020-11-19 ENCOUNTER — Other Ambulatory Visit: Payer: Self-pay

## 2020-11-19 DIAGNOSIS — G8929 Other chronic pain: Secondary | ICD-10-CM | POA: Insufficient documentation

## 2020-11-19 DIAGNOSIS — M6281 Muscle weakness (generalized): Secondary | ICD-10-CM | POA: Insufficient documentation

## 2020-11-19 DIAGNOSIS — M545 Low back pain, unspecified: Secondary | ICD-10-CM | POA: Diagnosis present

## 2020-11-19 DIAGNOSIS — R2689 Other abnormalities of gait and mobility: Secondary | ICD-10-CM | POA: Insufficient documentation

## 2020-11-19 NOTE — Patient Instructions (Signed)
Access Code: [email or web address] URL: [email or web address] Date: 11/19/2020 Prepared by: Rosana Hoes  Exercises Supine Pelvic Tilt - 3 x daily - 7 x weekly - 10 reps - 5 hold Supine March - 3 x daily - 7 x weekly - 2 sets - 10 reps Supine Lower Trunk Rotation - 3 x daily - 7 x weekly - 5 reps - 10 hold Hooklying Clamshell with Resistance - 3 x daily - 7 x weekly - 2 sets - 10 reps - 5 hold

## 2020-11-20 ENCOUNTER — Encounter: Payer: Self-pay | Admitting: Physical Therapy

## 2020-11-20 ENCOUNTER — Other Ambulatory Visit: Payer: Self-pay
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
## 2020-12-08 ENCOUNTER — Ambulatory Visit: Payer: Medicaid Other | Admitting: Physical Therapy

## 2020-12-10 ENCOUNTER — Telehealth: Payer: Self-pay | Admitting: Physical Therapy

## 2020-12-10 ENCOUNTER — Ambulatory Visit: Payer: Medicaid Other | Attending: Nurse Practitioner | Admitting: Physical Therapy

## 2020-12-10 DIAGNOSIS — G8929 Other chronic pain: Secondary | ICD-10-CM | POA: Insufficient documentation

## 2020-12-10 DIAGNOSIS — R2689 Other abnormalities of gait and mobility: Secondary | ICD-10-CM | POA: Insufficient documentation

## 2020-12-10 DIAGNOSIS — M6281 Muscle weakness (generalized): Secondary | ICD-10-CM | POA: Insufficient documentation

## 2020-12-10 DIAGNOSIS — M545 Low back pain, unspecified: Secondary | ICD-10-CM | POA: Insufficient documentation

## 2020-12-10 NOTE — Telephone Encounter (Signed)
Phone called made using interpreter Claudine #[id number]. Left VM informing patient of missed PT appointment and reminded him of next scheduled appointment. Informed patient of attendance no-show policy.   Rosana Hoes, PT, DPT, LAT, ATC 12/10/20  4:59 PM Phone: [phone] Fax: [phone]

## 2020-12-17 ENCOUNTER — Other Ambulatory Visit: Payer: Self-pay

## 2020-12-17 ENCOUNTER — Encounter: Payer: Self-pay | Admitting: Physical Therapy

## 2020-12-17 ENCOUNTER — Ambulatory Visit: Payer: Medicaid Other | Admitting: Physical Therapy

## 2020-12-17 DIAGNOSIS — R2689 Other abnormalities of gait and mobility: Secondary | ICD-10-CM | POA: Diagnosis present

## 2020-12-17 DIAGNOSIS — M6281 Muscle weakness (generalized): Secondary | ICD-10-CM

## 2020-12-17 DIAGNOSIS — G8929 Other chronic pain: Secondary | ICD-10-CM | POA: Diagnosis present

## 2020-12-17 DIAGNOSIS — M545 Low back pain, unspecified: Secondary | ICD-10-CM | POA: Diagnosis not present

## 2020-12-18 NOTE — Therapy (Addendum)
[place], [place], [address] Phone: [phone]   Fax:  [phone]  Physical Therapy Treatment / Discharge  Patient Details  Name: Todd Miller MRN: [id number] Date of Birth: 1986/09/18 Referring Provider (PT): Simona Huh, NP   Encounter Date: 12/17/2020   PT End of Session - 12/17/20 1726    Visit Number 2    Number of Visits 8    Date for PT Re-Evaluation 01/14/21    Authorization Type MCD [employer]    Authorization - Number of Visits 27    PT Start Time 6962    PT Stop Time 1744    PT Time Calculation (min) 40 min    Activity Tolerance Patient limited by pain    Behavior During Therapy [facility] for tasks assessed/performed           History reviewed. No pertinent past medical history.  Past Surgical History:  Procedure Laterality Date  . HAND SURGERY      There were no vitals filed for this visit.   Subjective Assessment - 12/17/20 1708    Subjective Patient reports same pain. States nothing has changed.    Patient is accompained by: Interpreter   Guerry Minors [phone]   Patient Stated Goals Relieve pain and move better    Currently in Pain? Yes    Pain Score 10-Worst pain ever    Pain Location Back    Pain Orientation Lower    Pain Descriptors / Indicators Sharp    Pain Onset More than a month ago    Pain Frequency Constant              [facility] PT Assessment - 12/18/20 0001      Functional Tests   Functional tests Sit to Stand      Sit to Stand   Comments Patient exhibits generally kyphotic posturing with increased lumbar flexion                         [facility] Adult PT Treatment/Exercise - 12/18/20 0001      Exercises   Exercises Lumbar      Lumbar Exercises: Stretches   Single Knee to Chest Stretch 2 reps;20 seconds    Lower Trunk Rotation Limitations x 10 with slow/controlled movement    Piriformis Stretch 2 reps;20 seconds    Piriformis Stretch Limitations  supine figure-4    Other Lumbar Stretch Exercise Child's pose x 30 sec      Lumbar Exercises: Aerobic   Recumbent Bike L1 x 3 min      Lumbar Exercises: Standing   Other Standing Lumbar Exercises Pallof press 10 x 3 sec each using FM 7#      Lumbar Exercises: Seated   Sit to Stand 10 reps    Sit to Stand Limitations focus on maintaining neutral lumbar control - patient tends to go into kyphosis      Lumbar Exercises: Supine   Pelvic Tilt 10 reps;5 seconds    Pelvic Tilt Limitations cued for slow and controlled movement through comfortable range    Straight Leg Raise 5 reps   2 sets   Other Supine Lumbar Exercises Bent knee fall out 2 x 5 each      Lumbar Exercises: Prone   Other Prone Lumbar Exercises Bent knee hip extension 2 x 5 each      Lumbar Exercises: Quadruped   Madcat/Old Horse 5 reps   2 sets   Other  Quadruped Lumbar Exercises Alternating hip extension x 5 each    Other Quadruped Lumbar Exercises Tail wag 2 x 5                  PT Education - 12/18/20 1056    Education Details HEP consistency, walking progression for aerobic exercise    Person(s) Educated Patient    Methods Explanation    Comprehension Verbalized understanding;Need further instruction            PT Short Term Goals - 11/20/20 0829      PT SHORT TERM GOAL #1   Title Patient will be I with initial HEP to progress with PT    Baseline HEP provided at evaluation    Time 4    Period Weeks    Status New    Target Date 12/17/20      PT SHORT TERM GOAL #2   Title Patient will report pain level with activity </= 7/10 in order to reduce functional limitation    Baseline 10/10 pain level reported this visit    Time 4    Period Weeks    Status New    Target Date 12/17/20      PT SHORT TERM GOAL #3   Title Patient will demonstrate 25% improvement in lumbar motion in all planes to improve ability to perform household tasks    Baseline Patient exhibits gross lumbar motion at 50% in all  planes    Time 4    Period Weeks    Status New    Target Date 12/17/20             PT Long Term Goals - 11/20/20 0832      PT LONG TERM GOAL #1   Title Patient will be I with final HEP to maintain progress from PT    Baseline HEP issued at evaluation    Time 8    Period Weeks    Status New    Target Date 01/14/21      PT LONG TERM GOAL #2   Title Patient will be able to walk and stand >/= 30 minutes without increase in pain in order to improve community access and shopping    Baseline Patient only able to walk or stand 15 minutes    Time 8    Period Weeks    Status New    Target Date 01/14/21      PT LONG TERM GOAL #3   Title Patient will be able to sit >/= 1 hour to improve driving    Baseline Patient able to sit 30 minutes    Time 8    Period Weeks    Status New    Target Date 01/14/21      PT LONG TERM GOAL #4   Title Patient will demonstrate gross hip and core strength >/= 4/5 MMT in order to improve lifting ability    Baseline Patient exhibits gross hip and core strenght 3/5 MMT secondary to pain    Time 8    Period Weeks    Status New    Target Date 01/14/21      PT LONG TERM GOAL #5   Title Patient will report pain with activity </= 5/10 in order to reduce functional limitation    Baseline 10/10 pain reported at evaluation    Time 8    Period Weeks    Status New    Target Date 01/14/21  Plan - 12/18/20 1057    Clinical Impression Statement Patient continues to be limited by pain, reporting 10/10 pain level pre therapy and also reporting increased pain with exercises. He is only able to report low repetitions of exercises and requires frequent rest breaks. Therapy focused on progressing lumbar mobility and core strengthening exercises. He requires constant cueing for lumbopelvic control and moving through comfortable range. No change to current HEP, he was encouraged to work on consistency and increased walking for aerobic exercise.  He would benefit from continued skilled PT to progress his mobility and strength in order to reduce pain and maximize functional level.    PT Treatment/Interventions ADLs/Self Care Home Management;Cryotherapy;Electrical Stimulation;Moist Heat;Traction;Ultrasound;Neuromuscular re-education;Balance training;Therapeutic exercise;Therapeutic activities;Functional mobility training;Stair training;Gait training;Patient/family education;Manual techniques;Dry needling;Passive range of motion;Taping;Spinal Manipulations;Joint Manipulations    PT Next Visit Plan Review HEP and progress PRN, manual and light stretching as tolerated to reduce pain and improve mobility, continue light progression or core/hip strengthening    PT Home Exercise Plan [id number]: supine pelvic tilt (can also do seated), supine marching, supine clam with yellow, LTR    Consulted and Agree with Plan of Care Patient           Patient will benefit from skilled therapeutic intervention in order to improve the following deficits and impairments:  Decreased range of motion,Difficulty walking,Decreased strength,Postural dysfunction,Improper body mechanics,Impaired flexibility,Decreased activity tolerance,Pain  Visit Diagnosis: Chronic bilateral low back pain, unspecified whether sciatica present  Muscle weakness (generalized)  Other abnormalities of gait and mobility     Problem List Patient Active Problem List   Diagnosis Date Noted  . Chest pain 10/03/2019  . Tinea versicolor 09/28/2016  . Chronic bilateral low back pain with bilateral sciatica 07/17/2016  . Poor appetite 04/30/2016  . Globus sensation 04/18/2016  . Anemia 02/19/2016  . Vitamin D deficiency 02/19/2016  . Anxiety state 01/23/2016  . Refugee health examination 01/23/2016  . Tobacco use disorder 01/23/2016    Hilda Blades, PT, DPT, LAT, ATC 12/18/20  11:05 AM Phone: [phone] Fax: [facility]  [address] [place], [place], [address] Phone: [phone]   Fax:  [phone]  Name: Todd Miller MRN: [id number] Date of Birth: 06-Jul-1986   PHYSICAL THERAPY DISCHARGE SUMMARY  Visits from Start of Care: 2  Current functional level related to goals / functional outcomes: See above   Remaining deficits: See above   Education / Equipment: HEP Plan: Patient agrees to discharge.  Patient goals were not met. Patient is being discharged due to not returning since the last visit.  ?????    Hilda Blades, PT, DPT, LAT, ATC 01/25/21  9:46 AM Phone: [phone] Fax: [phone]

## 2020-12-23 ENCOUNTER — Emergency Department (HOSPITAL_BASED_OUTPATIENT_CLINIC_OR_DEPARTMENT_OTHER)
Admission: EM | Admit: 2020-12-23 | Discharge: 2020-12-23 | Discharge status: Absent without leave | Payer: Medicaid Other

## 2020-12-23 ENCOUNTER — Ambulatory Visit (HOSPITAL_COMMUNITY)
Admission: EM | Admit: 2020-12-23 | Discharge: 2020-12-23 | Discharge status: Absent without leave | Payer: Medicaid Other

## 2020-12-24 ENCOUNTER — Ambulatory Visit: Payer: Medicaid Other | Attending: Nurse Practitioner | Admitting: Physical Therapy

## 2020-12-31 ENCOUNTER — Telehealth: Payer: Self-pay | Admitting: Physical Therapy

## 2020-12-31 ENCOUNTER — Ambulatory Visit: Payer: Medicaid Other | Admitting: Physical Therapy

## 2020-12-31 NOTE — Telephone Encounter (Signed)
Used [employer] digital interpreter for phone call: Mayam [id number].  Attempted to contact patient regarding missed PT appointment. Interpreter left VM informing patient that this was his second consecutive no-show and third since the start of therapy (12/10/20, 12/24/20, 12/31/20). Patient will be discharged from therapy per attendance policy, his upcoming appointments will be canceled, and he will need a new PT referral before he is able to follow-up with PT services.  Rosana Hoes, PT, DPT, LAT, ATC 12/31/20  5:23 PM Phone: [phone] Fax: [phone]

## 2021-01-07 ENCOUNTER — Encounter: Payer: Medicaid Other | Admitting: Physical Therapy

## 2021-01-14 ENCOUNTER — Encounter: Payer: Medicaid Other | Admitting: Physical Therapy

## 2021-03-02 ENCOUNTER — Other Ambulatory Visit: Payer: Self-pay

## 2021-03-02 ENCOUNTER — Emergency Department (HOSPITAL_COMMUNITY)
Admission: EM | Admit: 2021-03-02 | Discharge: 2021-03-03 | Disposition: A | Discharge status: Home / Self Care | Payer: Medicaid Other | Attending: Emergency Medicine | Admitting: Emergency Medicine

## 2021-03-02 DIAGNOSIS — Z5321 Procedure and treatment not carried out due to patient leaving prior to being seen by health care provider: Secondary | ICD-10-CM | POA: Insufficient documentation

## 2021-03-02 DIAGNOSIS — M549 Dorsalgia, unspecified: Secondary | ICD-10-CM | POA: Insufficient documentation

## 2021-03-02 DIAGNOSIS — G8929 Other chronic pain: Secondary | ICD-10-CM | POA: Insufficient documentation

## 2021-03-02 MED ORDER — OXYCODONE-ACETAMINOPHEN 5-325 MG PO TABS
1.0000 | ORAL_TABLET | ORAL | Status: DC | PRN
  Administered 2021-03-02: 1 via ORAL
  Filled 2021-03-02: qty 1
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
## 2021-03-02 NOTE — ED Notes (Signed)
Patients wife states he cant wait in chair d/t pain and she was going to take him home

## 2021-05-26 ENCOUNTER — Ambulatory Visit (HOSPITAL_COMMUNITY)
Admission: EM | Admit: 2021-05-26 | Discharge: 2021-05-26 | Disposition: A | Discharge status: Home / Self Care | Payer: Medicaid Other

## 2021-05-26 ENCOUNTER — Encounter (HOSPITAL_COMMUNITY): Payer: Self-pay

## 2021-05-26 ENCOUNTER — Other Ambulatory Visit: Payer: Self-pay

## 2021-05-26 DIAGNOSIS — M5442 Lumbago with sciatica, left side: Secondary | ICD-10-CM

## 2021-05-26 DIAGNOSIS — M5441 Lumbago with sciatica, right side: Secondary | ICD-10-CM | POA: Diagnosis not present

## 2021-05-26 DIAGNOSIS — G8929 Other chronic pain: Secondary | ICD-10-CM

## 2021-05-26 HISTORY — DX: Dorsalgia, unspecified: M54.9

## 2021-05-26 NOTE — ED Provider Notes (Signed)
[facility]    CSN: [id number] Arrival date & time: 05/26/21  1655      History   Chief Complaint Chief Complaint  Patient presents with  . Back Pain    HPI Todd Miller is a 35 y.o. male.   Patient presents today with a several year history of chronic lower back pain that has worsened in the past several months.  He is Arabic speaking and video interpreter was utilized during visit.  He is followed by pain management and prescribed oxycodone every 4 hours.  Reports that pain has worsened and so he began taking medication more frequently and is since run out.  His last refill was given on 05/01/2021 by pain management provider Dr. Courtney Paris and patient will be due for refill on 05/31/2021.  He contacted pharmacy and they said they could refill this as early as 05/29/2021 but could not give it to him today.  He has been using other medications including muscle relaxers and over-the-counter analgesics without improvement of symptoms.  He denies any sudden worsening of pain and states pain is gradually been worsening over the past several months.  He denies any bowel/bladder incontinence, lower extremity weakness, saddle anesthesia.  His last MRI was obtained 02/16/2018 that was normal.  He had lumbar CT 06/12/2020 that showed degenerative changes.     Past Medical History:  Diagnosis Date  . Back pain     Patient Active Problem List   Diagnosis Date Noted  . Chest pain 10/03/2019  . Tinea versicolor 09/28/2016  . Chronic bilateral low back pain with bilateral sciatica 07/17/2016  . Poor appetite 04/30/2016  . Globus sensation 04/18/2016  . Anemia 02/19/2016  . Vitamin D deficiency 02/19/2016  . Anxiety state 01/23/2016  . Refugee health examination 01/23/2016  . Tobacco use disorder 01/23/2016    Past Surgical History:  Procedure Laterality Date  . HAND SURGERY         Home Medications    Prior to Admission medications   Medication Sig Start Date  End Date Taking? Authorizing Provider  amitriptyline (ELAVIL) 25 MG tablet Take 1 tablet (25 mg total) by mouth at bedtime. Patient not taking: Reported on 06/12/2020 12/02/17   Durward Parcel, DO  gabapentin (NEURONTIN) 100 MG capsule Take 1 capsule (100 mg total) by mouth 3 (three) times daily. 02/17/18   Kathryne Hitch, MD  methocarbamol (ROBAXIN) 500 MG tablet Take 1 tablet (500 mg total) by mouth every 8 (eight) hours as needed for muscle spasms. 06/12/20   Rancour, Jeannett Senior, MD  naproxen (NAPROSYN) 500 MG tablet Take 1 tablet (500 mg total) by mouth 2 (two) times daily as needed. 06/12/20   Rancour, Jeannett Senior, MD  oxyCODONE-acetaminophen (PERCOCET) 10-325 MG tablet Take 1 tablet by mouth every 4 (four) hours as needed for pain.    [provider]  pantoprazole (PROTONIX) 40 MG tablet Take 1 tablet (40 mg total) by mouth daily for 14 days. Patient not taking: Reported on 06/12/2020 10/06/19 10/20/19  Shirley, [place], DO  tiZANidine (ZANAFLEX) 4 MG tablet Take 4 mg by mouth every 6 (six) hours as needed for muscle spasms.    [provider]  Vitamin D, Ergocalciferol, (DRISDOL) 1.25 MG (50000 UNIT) CAPS capsule Take 1 capsule by mouth once a week. 03/02/21   [provider]    Family History Family History  Problem Relation Age of Onset  . Healthy Mother   . Healthy Father     Social History Social  History   Tobacco Use  . Smoking status: Current Some Day Smoker    Types: Pipe  . Smokeless tobacco: Never Used  Substance Use Topics  . Alcohol use: Never  . Drug use: Never     Allergies   Patient has no known allergies.   Review of Systems Review of Systems  Constitutional: Positive for activity change. Negative for appetite change, fatigue and fever.  Respiratory: Negative for cough and shortness of breath.   Cardiovascular: Negative for chest pain.  Gastrointestinal: Negative for abdominal pain, diarrhea, nausea and vomiting.   Musculoskeletal: Positive for back pain and gait problem. Negative for arthralgias and myalgias.  Neurological: Negative for dizziness, weakness, light-headedness, numbness and headaches.     Physical Exam Triage Vital Signs ED Triage Vitals  Enc Vitals Group     BP 05/26/21 1753 115/76     Pulse Rate 05/26/21 1753 75     Resp 05/26/21 1753 18     Temp --      Temp Source 05/26/21 1753 Oral     SpO2 05/26/21 1753 97 %     Weight --      Height --      Head Circumference --      Peak Flow --      Pain Score 05/26/21 1748 10     Pain Loc --      Pain Edu? --      Excl. in GC? --    No data found.  Updated Vital Signs BP 115/76 (BP Location: Right Arm)   Pulse 75   Resp 18   SpO2 97%   Visual Acuity Right Eye Distance:   Left Eye Distance:   Bilateral Distance:    Right Eye Near:   Left Eye Near:    Bilateral Near:     Physical Exam Vitals reviewed.  Constitutional:      General: He is awake.     Appearance: Normal appearance. He is normal weight. He is not ill-appearing.     Comments: Very pleasant male appears stated age in no acute distress sitting in wheelchair  HENT:     Head: Normocephalic and atraumatic.  Cardiovascular:     Rate and Rhythm: Normal rate and regular rhythm.     Heart sounds: Normal heart sounds. No murmur heard.   Pulmonary:     Effort: Pulmonary effort is normal.     Breath sounds: Normal breath sounds. No stridor. No wheezing, rhonchi or rales.     Comments: Clear to auscultation bilaterally Abdominal:     General: Bowel sounds are normal.     Palpations: Abdomen is soft.     Tenderness: There is no abdominal tenderness.     Comments: Benign abdominal exam  Musculoskeletal:     Cervical back: No tenderness or bony tenderness.     Thoracic back: No tenderness or bony tenderness.     Lumbar back: Spasms, tenderness and bony tenderness present.     Comments: Patient unable to get onto exam table for complete examination.   Neurological:     Mental Status: He is alert.  Psychiatric:        Behavior: Behavior is cooperative.      UC Treatments / Results  Labs (all labs ordered are listed, but only abnormal results are displayed) Labs Reviewed - No data to display  EKG   Radiology No results found.  Procedures Procedures (including critical care time)  Medications Ordered in UC Medications - No  data to display  Initial Impression / Assessment and Plan / UC Course  I have reviewed the triage vital signs and the nursing notes.  Pertinent labs & imaging results that were available during my care of the patient were reviewed by me and considered in my medical decision making (see chart for details).     Discussed with patient that we are unable to provide refill of oxycodone for chronic pain.  He denies any alarm symptoms that there warrant emergent evaluation.  Encouraged him to follow-up with pain management to see if they can provide bridge prescription.  Offered ketorolac injection and/or muscle relaxers the patient declined this today.  Discussed that in addition to being a policy that we do not prescribe oxycodone for chronic pain, getting a prescription from an outside provider who is not in the emergency room or hospital who potentially avoid his pain management contract to which she expressed understanding.  Discussed alarm symptoms that would warrant emergent evaluation.  He was provided work excuse note.  All questions answered to patient satisfaction.  Strict return precautions given to which he expressed understanding.  Final Clinical Impressions(s) / UC Diagnoses   Final diagnoses:  Chronic bilateral low back pain with bilateral sciatica     Discharge Instructions     Please follow-up with your pain management doctor first thing tomorrow to see if they can extend or provide an additional prescription of oxycodone.  Continue your prescribed medications.  If anything worsens and you  develop any bowel/bladder incontinence, inability to move your legs, numbness of your thighs please go to the emergency room.    ED Prescriptions    None     I have reviewed the PDMP during this encounter.   Jeani Hawking, PA-C 05/26/21 1823
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
## 2021-05-26 NOTE — Discharge Instructions (Signed)
Please follow-up with your pain management doctor first thing tomorrow to see if they can extend or provide an additional prescription of oxycodone.  Continue your prescribed medications.  If anything worsens and you develop any bowel/bladder incontinence, inability to move your legs, numbness of your thighs please go to the emergency room.

## 2022-04-12 ENCOUNTER — Encounter (HOSPITAL_COMMUNITY): Payer: Self-pay | Admitting: Emergency Medicine

## 2022-04-12 ENCOUNTER — Other Ambulatory Visit: Payer: Self-pay

## 2022-04-12 ENCOUNTER — Ambulatory Visit (HOSPITAL_COMMUNITY)
Admission: EM | Admit: 2022-04-12 | Discharge: 2022-04-12 | Disposition: A | Discharge status: Home / Self Care | Payer: Medicaid Other | Attending: Family Medicine | Admitting: Family Medicine

## 2022-04-12 DIAGNOSIS — K0889 Other specified disorders of teeth and supporting structures: Secondary | ICD-10-CM

## 2022-04-12 MED ORDER — AMOXICILLIN 875 MG PO TABS: 875.0000 mg | ORAL_TABLET | Freq: Two times a day (BID) | ORAL | 0 refills | Status: AC

## 2022-04-12 MED ORDER — KETOROLAC TROMETHAMINE 60 MG/2ML IM SOLN
INTRAMUSCULAR | Status: AC
  Filled 2022-04-12: qty 2

## 2022-04-12 MED ORDER — KETOROLAC TROMETHAMINE 60 MG/2ML IM SOLN
60.0000 mg | Freq: Once | INTRAMUSCULAR | Status: AC
  Administered 2022-04-12: 60 mg via INTRAMUSCULAR

## 2022-04-12 NOTE — ED Triage Notes (Addendum)
Front top teeth are painful.  Pain started yesterday.  Patient has a history of this pain.  Pain was significantly worse yesterday and today.  Son with patient translating ?

## 2022-04-12 NOTE — Discharge Instructions (Signed)
Meds ordered this encounter  Medications   ketorolac (TORADOL) injection 60 mg    

## 2022-04-14 NOTE — ED Provider Notes (Signed)
?  [facility] ? ? ?[id number] ?04/12/22 Arrival Time: 1724 ? ?ASSESSMENT & PLAN: ? ?1. Pain, dental   ? ?No sign of abscess requiring I&D at this time. Discussed. ? ?Meds ordered this encounter  ?Medications  ? ketorolac (TORADOL) injection 60 mg  ? amoxicillin (AMOXIL) 875 MG tablet  ?  Sig: Take 1 tablet (875 mg total) by mouth 2 (two) times daily for 10 days.  ?  Dispense:  20 tablet  ?  Refill:  0  ? ?Reports he has planned dental f/u. ? ?Multiple requests for narcotic pain medications. Is currently on oxycodone chronically. Denied. ? ?Reviewed expectations re: course of current medical issues. Questions answered. ?Outlined signs and symptoms indicating need for more acute intervention. ?Patient verbalized understanding. ?After Visit Summary given. ? ? ?SUBJECTIVE: ? ?Todd Miller is a 36 y.o. male who reports  R upper dental pain; acute on chronic.   Fever: absent. Tolerating PO intake but reports pain with chewing. Normal swallowing. He does not see a dentist regularly. No neck swelling or pain. Oxycodone and OTC analgesics without relief. ? ? ?OBJECTIVE: ?Vitals:  ? 04/12/22 1834  ?BP: (!) 141/95  ?Pulse: 85  ?Resp: 18  ?Temp: 98 ?F (36.7 ?C)  ?TempSrc: Oral  ?SpO2: 100%  ?  ?General appearance: alert; no distress ?HENT: normocephalic; atraumatic; dentition: fair; right upper gum without areas of fluctuance, drainage, or bleeding and with tenderness to palpation; normal jaw movement without difficulty ?Neck: supple without LAD; FROM; trachea midline ?Lungs: normal respirations; unlabored; speaks full sentences without difficulty ?Skin: warm and dry ?Psychological: alert and cooperative; normal mood and affect ? ?No Known Allergies ? ?Past Medical History:  ?Diagnosis Date  ? Back pain   ? ?Social History  ? ?Socioeconomic History  ? Marital status: Married  ?  Spouse name: Not on file  ? Number of children: Not on file  ? Years of education: Not on file  ? Highest education level: Not on file   ?Occupational History  ? Not on file  ?Tobacco Use  ? Smoking status: Some Days  ?  Types: Pipe  ? Smokeless tobacco: Never  ?Vaping Use  ? Vaping Use: Every day  ?Substance and Sexual Activity  ? Alcohol use: Never  ? Drug use: Never  ? Sexual activity: Yes  ?Other Topics Concern  ? Not on file  ?Social History Narrative  ? Not on file  ? ?Social Determinants of Health  ? ?Financial Resource Strain: Not on file  ?Food Insecurity: Not on file  ?Transportation Needs: Not on file  ?Physical Activity: Not on file  ?Stress: Not on file  ?Social Connections: Not on file  ?Intimate Partner Violence: Not on file  ? ?Family History  ?Problem Relation Age of Onset  ? Healthy Mother   ? Healthy Father   ? ?Past Surgical History:  ?Procedure Laterality Date  ? HAND SURGERY    ? ? ?  ?Mardella Layman, MD ?04/14/22 2440 ? ?

## 2022-08-29 ENCOUNTER — Emergency Department (HOSPITAL_COMMUNITY): Payer: Medicaid Other

## 2022-08-29 ENCOUNTER — Emergency Department (HOSPITAL_COMMUNITY)
Admission: EM | Admit: 2022-08-29 | Discharge: 2022-08-29 | Disposition: A | Discharge status: Home / Self Care | Payer: Medicaid Other | Attending: Emergency Medicine | Admitting: Emergency Medicine

## 2022-08-29 DIAGNOSIS — M549 Dorsalgia, unspecified: Secondary | ICD-10-CM | POA: Diagnosis present

## 2022-08-29 DIAGNOSIS — M546 Pain in thoracic spine: Secondary | ICD-10-CM | POA: Diagnosis not present

## 2022-08-29 DIAGNOSIS — Y92481 Parking lot as the place of occurrence of the external cause: Secondary | ICD-10-CM | POA: Diagnosis not present

## 2022-08-29 DIAGNOSIS — G8911 Acute pain due to trauma: Secondary | ICD-10-CM | POA: Diagnosis not present

## 2022-08-29 DIAGNOSIS — M545 Low back pain, unspecified: Secondary | ICD-10-CM | POA: Diagnosis not present

## 2022-08-29 MED ORDER — CYCLOBENZAPRINE HCL 10 MG PO TABS: 10.0000 mg | ORAL_TABLET | Freq: Once | ORAL | Status: DC

## 2022-08-29 MED ORDER — FENTANYL CITRATE PF 50 MCG/ML IJ SOSY
50.0000 ug | PREFILLED_SYRINGE | Freq: Once | INTRAMUSCULAR | Status: AC
  Administered 2022-08-29: 50 ug via INTRAVENOUS
  Filled 2022-08-29: qty 1
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
## 2023-02-23 ENCOUNTER — Emergency Department (HOSPITAL_COMMUNITY): Payer: Medicaid Other

## 2023-02-23 ENCOUNTER — Encounter (HOSPITAL_COMMUNITY): Payer: Self-pay

## 2023-02-23 ENCOUNTER — Emergency Department (HOSPITAL_COMMUNITY)
Admission: EM | Admit: 2023-02-23 | Discharge: 2023-02-23 | Discharge status: Against Medical Advice | Payer: Medicaid Other | Attending: Emergency Medicine | Admitting: Emergency Medicine

## 2023-02-23 ENCOUNTER — Other Ambulatory Visit: Payer: Self-pay

## 2023-02-23 DIAGNOSIS — R202 Paresthesia of skin: Secondary | ICD-10-CM | POA: Insufficient documentation

## 2023-02-23 DIAGNOSIS — M79602 Pain in left arm: Secondary | ICD-10-CM | POA: Insufficient documentation

## 2023-02-23 DIAGNOSIS — R079 Chest pain, unspecified: Secondary | ICD-10-CM | POA: Insufficient documentation

## 2023-02-23 DIAGNOSIS — Z5321 Procedure and treatment not carried out due to patient leaving prior to being seen by health care provider: Secondary | ICD-10-CM | POA: Diagnosis not present

## 2023-02-23 LAB — BASIC METABOLIC PANEL
Anion gap: 7 (ref 5–15)
BUN: 9 mg/dL (ref 6–20)
CO2: 27 mmol/L (ref 22–32)
Calcium: 9.1 mg/dL (ref 8.9–10.3)
Chloride: 101 mmol/L (ref 98–111)
Creatinine, Ser: 0.79 mg/dL (ref 0.61–1.24)
GFR, Estimated: >60 mL/min (ref >60)
Glucose, Bld: 116 mg/dL — ABNORMAL HIGH (ref 70–99)
Potassium: 3.9 mmol/L (ref 3.5–5.1)
Sodium: 135 mmol/L (ref 135–145)

## 2023-02-23 LAB — CBC
HCT: 40.3 % (ref 39.0–52.0)
Hemoglobin: 12.7 g/dL — ABNORMAL LOW (ref 13.0–17.0)
MCH: 20 pg — ABNORMAL LOW (ref 26.0–34.0)
MCHC: 31.5 g/dL (ref 30.0–36.0)
MCV: 63.6 fL — ABNORMAL LOW (ref 80.0–100.0)
Platelets: 167 10*3/uL (ref 150–400)
RBC: 6.34 MIL/uL — ABNORMAL HIGH (ref 4.22–5.81)
RDW: 14.8 % (ref 11.5–15.5)
WBC: 6.4 10*3/uL (ref 4.0–10.5)
nRBC: 0 % (ref 0.0–0.2)

## 2023-02-23 LAB — TROPONIN I (HIGH SENSITIVITY): Troponin I (High Sensitivity): 2 ng/L (ref <18)

## 2023-02-23 NOTE — ED Notes (Signed)
Pt notified staff that they are leaving

## 2023-02-23 NOTE — ED Triage Notes (Signed)
Family request daughter interpret over phone.   Left chest pains that radiate to left shoulder and arm x 1 week.   Pain has been tolerable but intensified today.

## 2023-02-23 NOTE — ED Provider Triage Note (Signed)
Emergency Medicine Provider Triage Evaluation Note  Todd Miller , a 37 y.o. male  was evaluated in triage.  Pt complains of chest pain for around a week.  Also said he had some left arm pain and tingling.  On oxycodone chronically  Review of Systems  Positive:  Negative:   Physical Exam  BP 125/78   Pulse 69   Temp 98.2 F (36.8 C) (Oral)   Resp 18   Ht '5\' 10"'$  (1.778 m)   Wt 68 kg   SpO2 98%   BMI 21.52 kg/m  Gen:   Awake, no distress   Resp:  Normal effort  MSK:   Moves extremities without difficulty  Other:  Rrr,ctab  Medical Decision Making  Medically screening exam initiated at 7:44 PM.  Appropriate orders placed.  Todd Miller was informed that the remainder of the evaluation will be completed by another provider, this initial triage assessment does not replace that evaluation, and the importance of remaining in the ED until their evaluation is complete.     Todd Miller, [place] 02/23/23 1946

## 2023-04-20 ENCOUNTER — Emergency Department (HOSPITAL_COMMUNITY): Payer: Medicaid Other

## 2023-04-20 ENCOUNTER — Emergency Department (HOSPITAL_COMMUNITY)
Admission: EM | Admit: 2023-04-20 | Discharge: 2023-04-20 | Disposition: A | Discharge status: Home / Self Care | Payer: Medicaid Other | Attending: Emergency Medicine | Admitting: Emergency Medicine

## 2023-04-20 ENCOUNTER — Other Ambulatory Visit: Payer: Self-pay

## 2023-04-20 ENCOUNTER — Encounter (HOSPITAL_COMMUNITY): Payer: Self-pay

## 2023-04-20 DIAGNOSIS — W1789XA Other fall from one level to another, initial encounter: Secondary | ICD-10-CM | POA: Diagnosis not present

## 2023-04-20 DIAGNOSIS — M545 Low back pain, unspecified: Secondary | ICD-10-CM | POA: Insufficient documentation

## 2023-04-20 MED ORDER — ONDANSETRON 8 MG PO TBDP
8.0000 mg | ORAL_TABLET | Freq: Once | ORAL | Status: AC
  Administered 2023-04-20: 8 mg via ORAL
  Filled 2023-04-20: qty 1

## 2023-04-20 MED ORDER — METHYLPREDNISOLONE 4 MG PO TBPK: ORAL_TABLET | ORAL | 0 refills | Status: AC

## 2023-04-20 MED ORDER — TRAMADOL HCL 50 MG PO TABS
50.0000 mg | ORAL_TABLET | Freq: Once | ORAL | Status: AC
  Administered 2023-04-20: 50 mg via ORAL
  Filled 2023-04-20: qty 1

## 2023-04-20 MED ORDER — TRAMADOL HCL 50 MG PO TABS: 50.0000 mg | ORAL_TABLET | Freq: Four times a day (QID) | ORAL | 0 refills | Status: AC | PRN

## 2023-04-20 MED ORDER — DEXAMETHASONE SODIUM PHOSPHATE 10 MG/ML IJ SOLN
10.0000 mg | Freq: Once | INTRAMUSCULAR | Status: AC
  Administered 2023-04-20: 10 mg via INTRAMUSCULAR
  Filled 2023-04-20: qty 1

## 2023-04-20 MED ORDER — HYDROCODONE-ACETAMINOPHEN 5-325 MG PO TABS
1.0000 | ORAL_TABLET | Freq: Once | ORAL | Status: AC
  Administered 2023-04-20: 1 via ORAL
  Filled 2023-04-20: qty 1

## 2023-04-20 MED ORDER — IBUPROFEN 800 MG PO TABS: 800.0000 mg | ORAL_TABLET | Freq: Three times a day (TID) | ORAL | 0 refills | Status: AC

## 2023-04-20 MED ORDER — CYCLOBENZAPRINE HCL 10 MG PO TABS: 10.0000 mg | ORAL_TABLET | Freq: Two times a day (BID) | ORAL | 0 refills | Status: DC | PRN

## 2023-04-20 MED ORDER — LIDOCAINE 5 % EX PTCH
1.0000 | MEDICATED_PATCH | CUTANEOUS | Status: DC
  Administered 2023-04-20: 1 via TRANSDERMAL
  Filled 2023-04-20: qty 1

## 2023-04-20 MED ORDER — KETOROLAC TROMETHAMINE 30 MG/ML IJ SOLN
30.0000 mg | Freq: Once | INTRAMUSCULAR | Status: AC
  Administered 2023-04-20: 30 mg via INTRAMUSCULAR
  Filled 2023-04-20: qty 1

## 2023-04-20 NOTE — ED Provider Notes (Signed)
[place] EMERGENCY DEPARTMENT AT [facility] Provider Note   CSN: [id number] Arrival date & time: 04/20/23  1939     History  Chief Complaint  Patient presents with   Fall   Back Pain    Todd Miller is a 37 y.o. male.  Patient here with low back pain after he fell while cutting some tree limbs.  Maybe fell from 5 to 6 feet.  Landed on his low back.  Did not hit his head or lose consciousness.  He is not on blood thinners.  He already takes chronic narcotics and gabapentin for chronic back pain.  He has been ambulatory.  This happened a little bit prior to arrival.  He does not have any abdominal pain or other extremity pain.  He drove here himself.  He took his pain medicines at home and has not helped.  He has no neck pain.   The history is provided by the patient.       Home Medications Prior to Admission medications   Medication Sig Start Date End Date Taking? Authorizing Provider  ibuprofen (ADVIL) 800 MG tablet Take 1 tablet (800 mg total) by mouth 3 (three) times daily. 04/20/23  Yes Rameen Quinney, DO  methylPREDNISolone (MEDROL DOSEPAK) 4 MG TBPK tablet Follow package insert 04/20/23  Yes Adele Milson, DO  traMADol (ULTRAM) 50 MG tablet Take 1 tablet (50 mg total) by mouth every 6 (six) hours as needed for up to 15 doses. 04/20/23  Yes Isadora Delorey, DO  amitriptyline (ELAVIL) 25 MG tablet Take 1 tablet (25 mg total) by mouth at bedtime. Patient not taking: Reported on 06/12/2020 12/02/17   Durward Parcel, DO  gabapentin (NEURONTIN) 100 MG capsule Take 1 capsule (100 mg total) by mouth 3 (three) times daily. 02/17/18   Kathryne Hitch, MD  methocarbamol (ROBAXIN) 500 MG tablet Take 1 tablet (500 mg total) by mouth every 8 (eight) hours as needed for muscle spasms. Patient not taking: Reported on 04/12/2022 06/12/20   Glynn Octave, MD  oxyCODONE-acetaminophen (PERCOCET) 10-325 MG tablet Take 1 tablet by mouth every 4 (four) hours as needed  for pain.    [provider]  pantoprazole (PROTONIX) 40 MG tablet Take 1 tablet (40 mg total) by mouth daily for 14 days. Patient not taking: Reported on 06/12/2020 10/06/19 10/20/19  Shirley, [place], DO  tiZANidine (ZANAFLEX) 4 MG tablet Take 4 mg by mouth every 6 (six) hours as needed for muscle spasms. Patient not taking: Reported on 04/12/2022    [provider]  Vitamin D, Ergocalciferol, (DRISDOL) 1.25 MG (50000 UNIT) CAPS capsule Take 1 capsule by mouth once a week. 03/02/21   [provider]      Allergies    Patient has no known allergies.    Review of Systems   Review of Systems  Physical Exam Updated Vital Signs BP (!) 134/93 (BP Location: Right Arm)   Pulse 89   Temp 97.7 F (36.5 C) (Oral)   Resp 18   SpO2 95%  Physical Exam Vitals and nursing note reviewed.  Constitutional:      General: He is not in acute distress.    Appearance: He is well-developed. He is not ill-appearing.  HENT:     Head: Normocephalic and atraumatic.     Nose: Nose normal.     Mouth/Throat:     Mouth: Mucous membranes are moist.  Eyes:     Extraocular Movements: Extraocular movements intact.     Conjunctiva/sclera:  Conjunctivae normal.     Pupils: Pupils are equal, round, and reactive to light.  Cardiovascular:     Rate and Rhythm: Normal rate and regular rhythm.     Pulses: Normal pulses.     Heart sounds: Normal heart sounds. No murmur heard. Pulmonary:     Effort: Pulmonary effort is normal. No respiratory distress.     Breath sounds: Normal breath sounds.  Abdominal:     General: Abdomen is flat.     Palpations: Abdomen is soft.     Tenderness: There is no abdominal tenderness.  Musculoskeletal:        General: No swelling.     Cervical back: Normal range of motion and neck supple.     Comments: No midline spinal tenderness, tenderness to the paraspinal lumbar muscles bilaterally, no tenderness in the coccyx area  Skin:    General: Skin is warm and  dry.     Capillary Refill: Capillary refill takes less than 2 seconds.  Neurological:     General: No focal deficit present.     Mental Status: He is alert and oriented to person, place, and time.     Cranial Nerves: No cranial nerve deficit.     Sensory: No sensory deficit.     Motor: No weakness.     Coordination: Coordination normal.     Comments: 5+ out of 5 strength throughout, normal sensation  Psychiatric:        Mood and Affect: Mood normal.     ED Results / Procedures / Treatments   Labs (all labs ordered are listed, but only abnormal results are displayed) Labs Reviewed - No data to display  EKG None  Radiology CT Lumbar Spine Wo Contrast  Result Date: 04/20/2023 CLINICAL DATA:  Trauma.  Low back pain. EXAM: CT LUMBAR SPINE WITHOUT CONTRAST TECHNIQUE: Multidetector CT imaging of the lumbar spine was performed without intravenous contrast administration. Multiplanar CT image reconstructions were also generated. RADIATION DOSE REDUCTION: This exam was performed according to the departmental dose-optimization program which includes automated exposure control, adjustment of the mA and/or kV according to patient size and/or use of iterative reconstruction technique. COMPARISON:  None Available. FINDINGS: Segmentation: 5 lumbar type vertebrae. Alignment: Normal. Vertebrae: L5 limbus vertebra, a common congenital/developmental variant. No acute abnormality. Paraspinal and other soft tissues: Negative. Disc levels: No spinal canal stenosis. IMPRESSION: No acute abnormality of the lumbar spine. Electronically Signed   By: Deatra Robinson M.D.   On: 04/20/2023 21:06   DG Pelvis Portable  Result Date: 04/20/2023 CLINICAL DATA:  Fall from tree EXAM: PORTABLE PELVIS 1-2 VIEWS COMPARISON:  06/12/2020 FINDINGS: There is no evidence of pelvic fracture or diastasis. No pelvic bone lesions are seen. IMPRESSION: Negative. Electronically Signed   By: Duanne Guess D.O.   On: 04/20/2023 20:44     Procedures Procedures    Medications Ordered in ED Medications  lidocaine (LIDODERM) 5 % 1 patch (1 patch Transdermal Patch Applied 04/20/23 2152)  HYDROcodone-acetaminophen (NORCO/VICODIN) 5-325 MG per tablet 1 tablet (1 tablet Oral Given 04/20/23 2054)  ondansetron (ZOFRAN-ODT) disintegrating tablet 8 mg (8 mg Oral Given 04/20/23 2054)  ketorolac (TORADOL) 30 MG/ML injection 30 mg (30 mg Intramuscular Given 04/20/23 2151)  dexamethasone (DECADRON) injection 10 mg (10 mg Intramuscular Given 04/20/23 2152)  traMADol (ULTRAM) tablet 50 mg (50 mg Oral Given 04/20/23 2151)    ED Course/ Medical Decision Making/ A&P  Medical Decision Making Risk Prescription drug management.   [facility] is here with low back pain after he fell backwards while cutting tree limbs.  Did not hit his head or lose consciousness.  He is on a blood thinners.  He is having pain in his low back.  Mostly in the paraspinal muscles.  He has no midline spinal tenderness.  Neurologically he is intact.  He has been ambulatory since this happened.  He drove here himself.  He is already on chronic narcotics for back pain.  He is on oxycodone and gabapentin.  CT scan was done of the lumbar spine and per radiology report has no acute findings.  Pelvic x-ray negative for fracture as well per my review interpretation.  Overall suspect contusion.  He is on muscle relaxants already at home as well.  Patient was given Toradol shot, tramadol, lidocaine patch.  Will write him for tramadol for breakthrough pain to go along with his chronic narcotics.  Will prescribe Medrol Dosepak, ibuprofen.  Overall recommend follow-up with his chronic pain doctor.  Discharged in good condition.  He had no other injury elsewhere.  This chart was dictated using voice recognition software.  Despite best efforts to proofread,  errors can occur which can change the documentation meaning.         Final Clinical  Impression(s) / ED Diagnoses Final diagnoses:  Acute low back pain without sciatica, unspecified back pain laterality    Rx / DC Orders ED Discharge Orders          Ordered    cyclobenzaprine (FLEXERIL) 10 MG tablet  2 times daily PRN,   Status:  Discontinued        04/20/23 2144    ibuprofen (ADVIL) 800 MG tablet  3 times daily        04/20/23 2145    methylPREDNISolone (MEDROL DOSEPAK) 4 MG TBPK tablet        04/20/23 2147    traMADol (ULTRAM) 50 MG tablet  Every 6 hours PRN        04/20/23 2231              Virgina Norfolk, DO 04/20/23 2234

## 2023-04-20 NOTE — Discharge Instructions (Signed)
Continue your current pain meds.  Take tramadol every 6 hours as needed, follow Medrol Dosepak instructions, take 800 mg ibuprofen every 8 hours as needed as well.

## 2023-04-20 NOTE — ED Triage Notes (Signed)
Pt was climbing in a tree attempting to trim branches when he fell onto his back approx 7 feet. Occurred prior to arrival. Took oxycodone without relief. Denies hitting head or LOC.

## 2023-04-20 NOTE — ED Provider Triage Note (Signed)
Emergency Medicine Provider Triage Evaluation Note  Todd Miller , a 37 y.o. male  was evaluated in triage.  Pt complains of low back pain following a fall.  Has not ambulated since then.  States it was about 10 foot height.  Denies head injury, loss of consciousness.  Review of Systems  Positive: As above Negative: As above  Physical Exam  BP (!) 134/93 (BP Location: Right Arm)   Pulse 89   Temp 97.7 F (36.5 C) (Oral)   Resp 18   SpO2 95%  Gen:   Awake, no distress   Resp:  Normal effort  MSK:   Moves extremities without difficulty  Other:    Medical Decision Making  Medically screening exam initiated at 8:24 PM.  Appropriate orders placed.  Todd Miller was informed that the remainder of the evaluation will be completed by another provider, this initial triage assessment does not replace that evaluation, and the importance of remaining in the ED until their evaluation is complete.     Marita [place], PA-C 04/20/23 2025

## 2024-08-30 ENCOUNTER — Encounter (HOSPITAL_COMMUNITY): Payer: Self-pay | Admitting: Emergency Medicine

## 2024-08-30 ENCOUNTER — Emergency Department (HOSPITAL_COMMUNITY)
Admission: EM | Admit: 2024-08-30 | Discharge: 2024-08-30 | Discharge status: Against Medical Advice | Attending: Emergency Medicine | Admitting: Emergency Medicine

## 2024-08-30 ENCOUNTER — Other Ambulatory Visit: Payer: Self-pay

## 2024-08-30 DIAGNOSIS — R509 Fever, unspecified: Secondary | ICD-10-CM | POA: Diagnosis present

## 2024-08-30 DIAGNOSIS — Z5321 Procedure and treatment not carried out due to patient leaving prior to being seen by health care provider: Secondary | ICD-10-CM | POA: Diagnosis not present

## 2024-08-30 NOTE — ED Notes (Signed)
 This RN in room to collect respiratory swab.  Pt and wife ask how long this will take and if they will go to the lobby.  Pt informed that they will go back to the lobby to wait for a treatment room to become available while also waiting on test results.  Pt and wife state they are leaving and going to a different hospital.  Encouraged patient to stay but they are still leaving.

## 2024-08-30 NOTE — ED Triage Notes (Signed)
 Wife states patient was given tylenol  last at 9pm.  Had flu test done that was negative today.

## 2024-08-30 NOTE — ED Triage Notes (Signed)
 Pt to ED via [facility] c/o fever.  Was seen at [facility] earlier today, unsure dx but given tylenol .  Per EMS pt has been sick since yesterday with fever, no n/v/d.

## 2024-12-22 ENCOUNTER — Emergency Department (HOSPITAL_COMMUNITY)
Admission: EM | Admit: 2024-12-22 | Discharge: 2024-12-22 | Disposition: A | Discharge status: Home / Self Care | Attending: Emergency Medicine | Admitting: Emergency Medicine

## 2024-12-22 ENCOUNTER — Emergency Department (HOSPITAL_COMMUNITY)

## 2024-12-22 ENCOUNTER — Encounter (HOSPITAL_COMMUNITY): Payer: Self-pay | Admitting: *Deleted

## 2024-12-22 ENCOUNTER — Other Ambulatory Visit: Payer: Self-pay

## 2024-12-22 DIAGNOSIS — R1110 Vomiting, unspecified: Secondary | ICD-10-CM | POA: Insufficient documentation

## 2024-12-22 DIAGNOSIS — R1013 Epigastric pain: Secondary | ICD-10-CM | POA: Diagnosis not present

## 2024-12-22 DIAGNOSIS — R0602 Shortness of breath: Secondary | ICD-10-CM | POA: Diagnosis not present

## 2024-12-22 DIAGNOSIS — R1033 Periumbilical pain: Secondary | ICD-10-CM | POA: Diagnosis present

## 2024-12-22 HISTORY — DX: Thalassemia, unspecified: D56.9

## 2024-12-22 HISTORY — DX: Vitamin D deficiency, unspecified: E55.9

## 2024-12-22 LAB — URINALYSIS, W/ REFLEX TO CULTURE (INFECTION SUSPECTED)
Bacteria, UA: NONE SEEN
Bilirubin Urine: NEGATIVE
Glucose, UA: NEGATIVE mg/dL
Hgb urine dipstick: NEGATIVE
Ketones, ur: NEGATIVE mg/dL
Leukocytes,Ua: NEGATIVE
Nitrite: NEGATIVE
Protein, ur: NEGATIVE mg/dL
Specific Gravity, Urine: 1.03 (ref 1.005–1.030)
pH: 7 (ref 5.0–8.0)

## 2024-12-22 LAB — CBC
HCT: 40.8 % (ref 39.0–52.0)
Hemoglobin: 12.5 g/dL — ABNORMAL LOW (ref 13.0–17.0)
MCH: 19.1 pg — ABNORMAL LOW (ref 26.0–34.0)
MCHC: 30.6 g/dL (ref 30.0–36.0)
MCV: 62.2 fL — ABNORMAL LOW (ref 80.0–100.0)
Platelets: 155 K/uL (ref 150–400)
RBC: 6.56 MIL/uL — ABNORMAL HIGH (ref 4.22–5.81)
RDW: 15.8 % — ABNORMAL HIGH (ref 11.5–15.5)
WBC: 9.5 K/uL (ref 4.0–10.5)
nRBC: 0 % (ref 0.0–0.2)

## 2024-12-22 LAB — COMPREHENSIVE METABOLIC PANEL WITH GFR
ALT: 14 U/L (ref 0–44)
AST: 21 U/L (ref 15–41)
Albumin: 4.4 g/dL (ref 3.5–5.0)
Alkaline Phosphatase: 47 U/L (ref 38–126)
Anion gap: 10 (ref 5–15)
BUN: 10 mg/dL (ref 6–20)
CO2: 25 mmol/L (ref 22–32)
Calcium: 9.2 mg/dL (ref 8.9–10.3)
Chloride: 103 mmol/L (ref 98–111)
Creatinine, Ser: 0.75 mg/dL (ref 0.61–1.24)
GFR, Estimated: >60 mL/min
Glucose, Bld: 96 mg/dL (ref 70–99)
Potassium: 4.3 mmol/L (ref 3.5–5.1)
Sodium: 138 mmol/L (ref 135–145)
Total Bilirubin: 0.9 mg/dL (ref 0.0–1.2)
Total Protein: 6.9 g/dL (ref 6.5–8.1)

## 2024-12-22 LAB — TROPONIN T, HIGH SENSITIVITY
Troponin T High Sensitivity: <15 ng/L (ref 0–19)
Troponin T High Sensitivity: <15 ng/L (ref 0–19)

## 2024-12-22 LAB — LIPASE, BLOOD: Lipase: 23 U/L (ref 11–51)

## 2024-12-22 MED ORDER — IOHEXOL 350 MG/ML SOLN
75.0000 mL | Freq: Once | INTRAVENOUS | Status: AC | PRN
  Administered 2024-12-22: 75 mL via INTRAVENOUS

## 2024-12-22 MED ORDER — SODIUM CHLORIDE 0.9 % IV BOLUS
1000.0000 mL | Freq: Once | INTRAVENOUS | Status: AC
  Administered 2024-12-22: 1000 mL via INTRAVENOUS

## 2024-12-22 MED ORDER — ONDANSETRON HCL 4 MG/2ML IJ SOLN
4.0000 mg | Freq: Once | INTRAMUSCULAR | Status: AC
  Administered 2024-12-22: 4 mg via INTRAVENOUS
  Filled 2024-12-22: qty 2

## 2024-12-22 NOTE — Discharge Instructions (Signed)
 As we discussed your labs are unremarkable today.  Your CT scan shows splenomegaly which is unchanged from previous  Please stay hydrated and see your doctor for follow-up  Return to ER if you have severe abdominal pain or vomiting or trouble breathing

## 2024-12-22 NOTE — ED Provider Notes (Signed)
 " [place] EMERGENCY DEPARTMENT AT [facility] Provider Note   CSN: [id number] Arrival date & time: 12/22/24  1647     Patient presents with: Emesis and Shortness of Breath   Todd Miller is a 39 y.o. male otherwise healthy here presenting with abdominal pain and shortness of breath.  Patient has been having ongoing abdominal pain for the last several months.  Patient had a CT abdomen pelvis done in September that showed borderline splenomegaly.  Patient had some shortness of breath and vomiting today after working in the yard.  Patient denies any sick contacts.  Patient has history of anemia and had multiple labs sent and was referred to hematology outpatient   The history is provided by the patient.  Shortness of Breath Associated symptoms: vomiting        Prior to Admission medications  Medication Sig Start Date End Date Taking? Authorizing Provider  amitriptyline  (ELAVIL ) 25 MG tablet Take 1 tablet (25 mg total) by mouth at bedtime. Patient not taking: Reported on 06/12/2020 12/02/17   Ladell Lenis, DO  gabapentin  (NEURONTIN ) 100 MG capsule Take 1 capsule (100 mg total) by mouth 3 (three) times daily. 02/17/18   Vernetta Lonni GRADE, MD  ibuprofen  (ADVIL ) 800 MG tablet Take 1 tablet (800 mg total) by mouth 3 (three) times daily. 04/20/23   Curatolo, Adam, DO  methocarbamol  (ROBAXIN ) 500 MG tablet Take 1 tablet (500 mg total) by mouth every 8 (eight) hours as needed for muscle spasms. Patient not taking: Reported on 04/12/2022 06/12/20   Carita Senior, MD  methylPREDNISolone  (MEDROL  DOSEPAK) 4 MG TBPK tablet Follow package insert 04/20/23   Curatolo, Adam, DO  oxyCODONE -acetaminophen  (PERCOCET) 10-325 MG tablet Take 1 tablet by mouth every 4 (four) hours as needed for pain.    [provider]  pantoprazole  (PROTONIX ) 40 MG tablet Take 1 tablet (40 mg total) by mouth daily for 14 days. Patient not taking: Reported on 06/12/2020 10/06/19 10/20/19   Shirley, [place], DO  tiZANidine  (ZANAFLEX ) 4 MG tablet Take 4 mg by mouth every 6 (six) hours as needed for muscle spasms. Patient not taking: Reported on 04/12/2022    [provider]  traMADol  (ULTRAM ) 50 MG tablet Take 1 tablet (50 mg total) by mouth every 6 (six) hours as needed for up to 15 doses. 04/20/23   Curatolo, Adam, DO  Vitamin D , Ergocalciferol , (DRISDOL) 1.25 MG (50000 UNIT) CAPS capsule Take 1 capsule by mouth once a week. 03/02/21   [provider]    Allergies: Patient has no known allergies.    Review of Systems  Respiratory:  Positive for shortness of breath.   Gastrointestinal:  Positive for vomiting.  All other systems reviewed and are negative.   Updated Vital Signs BP 111/65   Pulse (!) 57   Temp (!) 97.4 F (36.3 C)   Resp 16   Ht 5' 10 (1.778 m)   Wt 72.6 kg   SpO2 99%   BMI 22.97 kg/m   Physical Exam Vitals and nursing note reviewed.  Constitutional:      Appearance: He is well-developed.  HENT:     Head: Normocephalic.     Mouth/Throat:     Mouth: Mucous membranes are moist.  Eyes:     Extraocular Movements: Extraocular movements intact.     Pupils: Pupils are equal, round, and reactive to light.  Cardiovascular:     Rate and Rhythm: Normal rate and regular rhythm.  Pulmonary:     Effort:  Pulmonary effort is normal.     Breath sounds: Normal breath sounds.  Abdominal:     General: Bowel sounds are normal.     Palpations: Abdomen is soft.     Comments: Mild periumbilical tenderness.  No obvious umbilical hernia or inguinal hernia  Musculoskeletal:        General: Normal range of motion.     Cervical back: Normal range of motion and neck supple.  Skin:    General: Skin is warm.     Capillary Refill: Capillary refill takes less than 2 seconds.  Neurological:     General: No focal deficit present.     Mental Status: He is alert and oriented to person, place, and time.  Psychiatric:        Mood and Affect: Mood normal.         Behavior: Behavior normal.     (all labs ordered are listed, but only abnormal results are displayed) Labs Reviewed  CBC - Abnormal; Notable for the following components:      Result Value   RBC 6.56 (*)    Hemoglobin 12.5 (*)    MCV 62.2 (*)    MCH 19.1 (*)    RDW 15.8 (*)    All other components within normal limits  LIPASE, BLOOD  COMPREHENSIVE METABOLIC PANEL WITH GFR  URINALYSIS, W/ REFLEX TO CULTURE (INFECTION SUSPECTED)  TROPONIN T, HIGH SENSITIVITY  TROPONIN T, HIGH SENSITIVITY    EKG: EKG Interpretation Date/Time:  Thursday December 22 2024 17:16:25 EST Ventricular Rate:  56 PR Interval:  130 QRS Duration:  82 QT Interval:  452 QTC Calculation: 436 R Axis:   64  Text Interpretation: Sinus bradycardia Otherwise normal ECG When compared with ECG of 23-Feb-2023 19:18, PREVIOUS ECG IS PRESENT since last tracing no significant change Confirmed by Lenor Hollering [phone]) on 12/22/2024 5:51:38 PM  Radiology: CT ABDOMEN PELVIS W CONTRAST Result Date: 12/22/2024 CLINICAL DATA:  Abdominal pain. EXAM: CT ABDOMEN AND PELVIS WITH CONTRAST TECHNIQUE: Multidetector CT imaging of the abdomen and pelvis was performed using the standard protocol following bolus administration of intravenous contrast. RADIATION DOSE REDUCTION: This exam was performed according to the departmental dose-optimization program which includes automated exposure control, adjustment of the mA and/or kV according to patient size and/or use of iterative reconstruction technique. CONTRAST:  75mL OMNIPAQUE  IOHEXOL  350 MG/ML SOLN COMPARISON:  CT dated 06/12/2020. FINDINGS: Lower chest: The visualized lung bases are clear. No intra-abdominal free air or free fluid. Hepatobiliary: The liver is unremarkable. Mild dilatation. The gallbladder is unremarkable. Pancreas: Unremarkable. No pancreatic ductal dilatation or surrounding inflammatory changes. Spleen: Mildly enlarged spleen measuring up to 16 cm in length.  Adrenals/Urinary Tract: The adrenal glands, kidneys, visualized ureters, and urinary bladder appear unremarkable. Stomach/Bowel: There is no bowel obstruction or active inflammation. The appendix is normal. Vascular/Lymphatic: The abdominal aorta and IVC are unremarkable. No portal venous gas. There is no adenopathy. Reproductive: The prostate and seminal vesicles are grossly unremarkable. Other: None Musculoskeletal: No acute or significant osseous findings. IMPRESSION: 1. No acute intra-abdominal or pelvic pathology. 2. Mild splenomegaly. Electronically Signed   By: Vanetta Chou M.D.   On: 12/22/2024 19:42   DG Chest 2 View Result Date: 12/22/2024 CLINICAL DATA:  Shortness of breath. EXAM: CHEST - 2 VIEW COMPARISON:  Chest CT dated 02/23/2023. FINDINGS: The heart size and mediastinal contours are within normal limits. Both lungs are clear. The visualized skeletal structures are unremarkable. IMPRESSION: No active cardiopulmonary disease. Electronically Signed   By:  Vanetta Chou M.D.   On: 12/22/2024 19:32     Procedures   Medications Ordered in the ED  sodium chloride  0.9 % bolus 1,000 mL (0 mLs Intravenous Stopped 12/22/24 2053)  ondansetron  (ZOFRAN ) injection 4 mg (4 mg Intravenous Given 12/22/24 1818)  iohexol  (OMNIPAQUE ) 350 MG/ML injection 75 mL (75 mLs Intravenous Contrast Given 12/22/24 1937)                                    Medical Decision Making Todd Miller is a 39 y.o. male who presented with abdominal pain and shortness of breath.  Abdominal pain has been going on for several months now.  Patient has borderline splenomegaly on previous CT scan.  Will repeat a CT to evaluate for splenomegaly and to look for gastroenteritis or colitis or appendicitis.  Also consider UTI or ACS.  Plan to get CBC CMP and troponin x 2 and chest x-ray  10 pm I reviewed patient's labs and hemoglobin is stable and white blood cell count is normal and chemistry and UA negative and troponin  negative x 2.  CT abdomen pelvis showed splenomegaly which is unchanged.  Stable for discharge   Amount and/or Complexity of Data Reviewed Labs: ordered.     Final diagnoses:  Epigastric pain  Shortness of breath    ED Discharge Orders     None          Patt Alm Macho, MD 12/22/24 2309  "

## 2024-12-22 NOTE — ED Notes (Signed)
 Pt reports feeling better and wife states they are ready to go- have kids at home.

## 2024-12-22 NOTE — ED Triage Notes (Signed)
 Pt here via [facility] for sob and emesis.  Pt was working in the yard, became sob, vomited for 2 minutes and sob improved.  Now pt c/o exhaustion, but nausea has improved.  Wife is holding sheets with abnormal lab results from a recent Dr's visit and is concerned they are related.  She states he wakes up each night with bil flank pain that radiates to the front.

## 2024-12-22 NOTE — ED Provider Triage Note (Signed)
 Emergency Medicine Provider Triage Evaluation Note  Todd Miller , a 39 y.o. male  was evaluated in triage.  Pt complains of abdominal pain, shortness of breath.  Patient has been having some pain in his bilateral flank rating to his abdomen for about 4 to 6 months.  He is being followed by [facility].  He started having some vomiting today and felt short of breath.  He denies any current shortness of breath.  No chest pain..  Review of Systems  Positive: Abdominal pain, vomiting, shortness of breath Negative: Cough, fever  Physical Exam  BP 117/79 (BP Location: Right Arm)   Pulse (!) 59   Temp 98.2 F (36.8 C) (Oral)   Resp 16   Ht 5' 10 (1.778 m)   Wt 72.6 kg   SpO2 98%   BMI 22.97 kg/m  Gen:   Awake, no distress    Resp:  Normal effort   MSK:   Moves extremities without difficulty   Other:     Medical Decision Making  Medically screening exam initiated at 5:52 PM.  Appropriate orders placed.  Todd Miller was informed that the remainder of the evaluation will be completed by another provider, this initial triage assessment does not replace that evaluation, and the importance of remaining in the ED until their evaluation is complete.      Lenor Hollering, MD 12/22/24 [phone]
